# Patient Record
Sex: Male | Born: 1970 | Race: White | Hispanic: No | Marital: Married | Smoking: Former smoker
Health system: Southern US, Community
[De-identification: ages and names within clinical notes are randomized; demographics above are authoritative.]

## PROBLEM LIST (undated history)

## (undated) DIAGNOSIS — I10 Essential (primary) hypertension: Secondary | ICD-10-CM

## (undated) DIAGNOSIS — I2510 Atherosclerotic heart disease of native coronary artery without angina pectoris: Secondary | ICD-10-CM

## (undated) DIAGNOSIS — E669 Obesity, unspecified: Secondary | ICD-10-CM

## (undated) DIAGNOSIS — E785 Hyperlipidemia, unspecified: Secondary | ICD-10-CM

## (undated) HISTORY — PX: HERNIA REPAIR: SHX51

## (undated) HISTORY — DX: Hyperlipidemia, unspecified: E78.5

## (undated) HISTORY — DX: Essential (primary) hypertension: I10

## (undated) HISTORY — DX: Atherosclerotic heart disease of native coronary artery without angina pectoris: I25.10

## (undated) HISTORY — DX: Obesity, unspecified: E66.9

---

## 2020-01-29 ENCOUNTER — Ambulatory Visit: Payer: Self-pay | Attending: Internal Medicine

## 2020-01-29 DIAGNOSIS — Z23 Encounter for immunization: Secondary | ICD-10-CM

## 2020-01-29 NOTE — Progress Notes (Signed)
   Covid-19 Vaccination Clinic  Name:  Nicholas Gillespie    MRN: [id number] DOB: Jun 30, 1971  01/29/2020  Nicholas Gillespie was observed post Covid-19 immunization for 15 minutes without incident. He was provided with Vaccine Information Sheet and instruction to access the V-Safe system.   Nicholas Gillespie was instructed to call 911 with any severe reactions post vaccine: Marland Kitchen Difficulty breathing  . Swelling of face and throat  . A fast heartbeat  . A bad rash all over body  . Dizziness and weakness   Immunizations Administered    Name Date Dose VIS Date Route   Pfizer COVID-19 Vaccine 01/29/2020  1:57 PM 0.3 mL 10/01/2019 Intramuscular   Manufacturer: [employer], [employer]   Lot: [id number]   NDC: [address]-0

## 2020-02-23 ENCOUNTER — Ambulatory Visit: Payer: Self-pay | Attending: Internal Medicine

## 2020-02-23 DIAGNOSIS — Z23 Encounter for immunization: Secondary | ICD-10-CM

## 2020-02-23 NOTE — Progress Notes (Signed)
   Covid-19 Vaccination Clinic  Name:  Nicholas Gillespie    MRN: [id number] DOB: 09/12/1971  02/23/2020  Nicholas Gillespie was observed post Covid-19 immunization for 15 minutes without incident. He was provided with Vaccine Information Sheet and instruction to access the V-Safe system.   Nicholas Gillespie was instructed to call 911 with any severe reactions post vaccine: Marland Kitchen Difficulty breathing  . Swelling of face and throat  . A fast heartbeat  . A bad rash all over body  . Dizziness and weakness   Immunizations Administered    Name Date Dose VIS Date Route   Pfizer COVID-19 Vaccine 02/23/2020  4:02 PM 0.3 mL 12/15/2018 Intramuscular   Manufacturer: [employer], [employer]   Lot: [id number]   NDC: [address]-1

## 2020-04-23 ENCOUNTER — Other Ambulatory Visit: Payer: Self-pay

## 2020-04-23 ENCOUNTER — Emergency Department (HOSPITAL_COMMUNITY): Payer: Managed Care, Other (non HMO)

## 2020-04-23 ENCOUNTER — Encounter (HOSPITAL_COMMUNITY): Payer: Self-pay

## 2020-04-23 ENCOUNTER — Emergency Department (HOSPITAL_COMMUNITY)
Admission: EM | Admit: 2020-04-23 | Discharge: 2020-04-23 | Disposition: A | Discharge status: Home / Self Care | Payer: Managed Care, Other (non HMO) | Attending: Emergency Medicine | Admitting: Emergency Medicine

## 2020-04-23 DIAGNOSIS — R7989 Other specified abnormal findings of blood chemistry: Secondary | ICD-10-CM | POA: Insufficient documentation

## 2020-04-23 DIAGNOSIS — R0789 Other chest pain: Secondary | ICD-10-CM | POA: Diagnosis not present

## 2020-04-23 DIAGNOSIS — R079 Chest pain, unspecified: Secondary | ICD-10-CM

## 2020-04-23 DIAGNOSIS — I10 Essential (primary) hypertension: Secondary | ICD-10-CM | POA: Diagnosis not present

## 2020-04-23 DIAGNOSIS — R000 Tachycardia, unspecified: Secondary | ICD-10-CM | POA: Insufficient documentation

## 2020-04-23 LAB — CBC
HCT: 46.7 % (ref 39.0–52.0)
Hemoglobin: 16.2 g/dL (ref 13.0–17.0)
MCH: 29.7 pg (ref 26.0–34.0)
MCHC: 34.7 g/dL (ref 30.0–36.0)
MCV: 85.5 fL (ref 80.0–100.0)
Platelets: 321 10*3/uL (ref 150–400)
RBC: 5.46 MIL/uL (ref 4.22–5.81)
RDW: 12.8 % (ref 11.5–15.5)
WBC: 7.6 10*3/uL (ref 4.0–10.5)
nRBC: 0 % (ref 0.0–0.2)

## 2020-04-23 LAB — BASIC METABOLIC PANEL
Anion gap: 9 (ref 5–15)
BUN: 16 mg/dL (ref 6–20)
CO2: 27 mmol/L (ref 22–32)
Calcium: 9.7 mg/dL (ref 8.9–10.3)
Chloride: 103 mmol/L (ref 98–111)
Creatinine, Ser: 1.34 mg/dL — ABNORMAL HIGH (ref 0.61–1.24)
GFR calc Af Amer: >60 mL/min (ref >60)
GFR calc non Af Amer: >60 mL/min (ref >60)
Glucose, Bld: 101 mg/dL — ABNORMAL HIGH (ref 70–99)
Potassium: 3.9 mmol/L (ref 3.5–5.1)
Sodium: 139 mmol/L (ref 135–145)

## 2020-04-23 LAB — TROPONIN I (HIGH SENSITIVITY): Troponin I (High Sensitivity): 3 ng/L (ref <18)

## 2020-04-23 NOTE — ED Notes (Signed)
Patient denies pain and is resting comfortably.  

## 2020-04-23 NOTE — Discharge Instructions (Addendum)
Please make sure to follow-up with your primary care doctor about your blood pressure and your slightly worsening kidney function.  Your work-up today was overall reassuring.  Return to the ER if your symptoms worsen.

## 2020-04-23 NOTE — ED Triage Notes (Signed)
Pt reports experiencing some indigestion yesterday. Pt reports hx of indigestion. Pt states he checked his BP this morning and it was elevated. Pt reports father had a "silent heart attack and stroke" and he is worried that is happening to him.

## 2020-04-23 NOTE — ED Provider Notes (Signed)
[facility]-EMERGENCY DEPT Provider Note   CSN: [id number] Arrival date & time: 04/23/20  1143     History Chief Complaint  Patient presents with  . Hypertension    Nicholas Gillespie is a 49 y.o. male.  HPI 49 year old male with no significant medical history presents to the ER with central chest tightness/burning across the chest which occurred yesterday morning.  He states that he has a history of indigestion, but normally does not have chest burning.  Yesterday morning he had an episode of chest tightness and burning that lasted for approximately an hour or 2.  He states that it then went away.  However then returned again in the evening.  He did not take anything for indigestion at the time.  After the episode of chest burning and tightness that occurred last night, he has not had any recurrent pain.  He states that his father had a silent MI and stroke in his worried about this which is brought him into the ER today.  He has not seen a primary care doctor in quite some time, but states that he thinks that he might have applied blood pressure.  He denies any shortness of breath, diaphoresis, radiating chest pain, cough, fevers, chills abdominal pain, back pain, nausea, vomiting, headache, weakness, numbness or tingling.    History reviewed. No pertinent past medical history.  There are no problems to display for this patient.   History reviewed. No pertinent surgical history.     History reviewed. No pertinent family history.  Social History   Tobacco Use  . Smoking status: Not on file  Substance Use Topics  . Alcohol use: Not on file  . Drug use: Not on file    Home Medications Prior to Admission medications   Not on File    Allergies    Patient has no allergy information on record.  Review of Systems   Review of Systems  Constitutional: Negative for chills and fever.  HENT: Negative for ear pain and sore throat.   Eyes: Negative for pain  and visual disturbance.  Respiratory: Positive for chest tightness. Negative for cough and shortness of breath.   Cardiovascular: Negative for chest pain and palpitations.  Gastrointestinal: Negative for abdominal pain and vomiting.  Genitourinary: Negative for dysuria and hematuria.  Musculoskeletal: Negative for arthralgias and back pain.  Skin: Negative for color change and rash.  Neurological: Negative for dizziness, seizures, syncope and weakness.  Psychiatric/Behavioral: Negative for confusion.  All other systems reviewed and are negative.   Physical Exam Updated Vital Signs BP (!) 156/103 (BP Location: Right Arm)   Pulse (!) 117   Temp 98.7 F (37.1 C) (Oral)   Resp 18   SpO2 100%   Physical Exam Vitals and nursing note reviewed.  Constitutional:      General: He is not in acute distress.    Appearance: Normal appearance. He is well-developed. He is not ill-appearing, toxic-appearing or diaphoretic.  HENT:     Head: Normocephalic and atraumatic.     Nose: Nose normal.     Mouth/Throat:     Pharynx: Oropharynx is clear.  Eyes:     Conjunctiva/sclera: Conjunctivae normal.     Pupils: Pupils are equal, round, and reactive to light.  Cardiovascular:     Rate and Rhythm: Normal rate and regular rhythm.     Heart sounds: No murmur heard.   Pulmonary:     Effort: Pulmonary effort is normal. No respiratory distress.  Breath sounds: Normal breath sounds.  Abdominal:     General: Abdomen is flat.     Palpations: Abdomen is soft.     Tenderness: There is no abdominal tenderness.  Musculoskeletal:        General: No tenderness or deformity. Normal range of motion.     Cervical back: Neck supple.  Skin:    General: Skin is warm and dry.     Findings: No erythema or rash.  Neurological:     General: No focal deficit present.     Mental Status: He is alert and oriented to person, place, and time.     Sensory: No sensory deficit.     Motor: No weakness.  Psychiatric:         Mood and Affect: Mood normal.        Behavior: Behavior normal.     ED Results / Procedures / Treatments   Labs (all labs ordered are listed, but only abnormal results are displayed) Labs Reviewed  BASIC METABOLIC PANEL - Abnormal; Notable for the following components:      Result Value   Glucose, Bld 101 (*)    Creatinine, Ser 1.34 (*)    All other components within normal limits  CBC  TROPONIN I (HIGH SENSITIVITY)  TROPONIN I (HIGH SENSITIVITY)    EKG EKG Interpretation  Date/Time:  Sunday April 23 2020 12:19:41 EDT Ventricular Rate:  107 PR Interval:    QRS Duration: 79 QT Interval:  323 QTC Calculation: 431 R Axis:   67 Text Interpretation: Sinus tachycardia Low voltage, precordial leads Borderline repolarization abnormality Confirmed by Allen, Anthony ([id number]) on 04/23/2020 1:59:38 PM   Radiology DG Chest 2 View  Result Date: 04/23/2020 CLINICAL DATA:  Chest pain EXAM: CHEST - 2 VIEW COMPARISON:  None. FINDINGS: The heart size and mediastinal contours are within normal limits. Both lungs are clear. The visualized skeletal structures are unremarkable. IMPRESSION: No active cardiopulmonary disease. Electronically Signed   By: Tyler  Litton M.D.   On: 04/23/2020 13:37    Procedures Procedures (including critical care time)  Medications Ordered in ED Medications - No data to display  ED Course  I have reviewed the triage vital signs and the nursing notes.  Pertinent labs & imaging results that were available during my care of the patient were reviewed by me and considered in my medical decision making (see chart for details).    MDM Rules/Calculators/A&P                         48  year old male with 2 episodes of chest pain that occurred yesterday, no episodes today On presentation, he is alert and oriented, nontoxic-appearing, in no acute distress, speaking in full sentences without increased work of breathing.  On presentation, he is slightly hypertensive  with a blood pressure of 156/103 and slightly tachycardic, however no signs of hypertensive urgency/emergency.  EKG sinus tachycardia, no ST elevations or T wave inversions.  CBC without abnormalities, BMP with a increased creatinine 1.34, however no other values to compare this to.  Initial troponin of 3.  Chest x-ray without acute abnormalities. Heart score of 3.  I do not think he needs a delta troponin given its been more than 6 hours.  I stressed that he needs to follow-up with his primary care doctor for wellness check.  Strict return precautions given.  All the patient's questions have been answered to his satisfaction, he voices understanding is agreeable to this plan.  At this stage in the ED course, the patient is medically screened and stable for discharge  Patient was seen and evaluated by Dr. Freida Busman who is agreeable to the above plan and disposition.  Final Clinical Impression(s) / ED Diagnoses Final diagnoses:  Chest pain, unspecified type  Hypertension, unspecified type  Elevated serum creatinine    Rx / DC Orders ED Discharge Orders    None       Leone Brand 04/23/20 1415    Lorre Nick, MD 04/23/20 1524

## 2020-04-23 NOTE — ED Provider Notes (Signed)
Medical screening examination/treatment/procedure(s) were conducted as a shared visit with non-physician practitioner(s) and myself.  I personally evaluated the patient during the encounter.  EKG Interpretation  Date/Time:  Sunday April 23 2020 12:19:41 EDT Ventricular Rate:  107 PR Interval:    QRS Duration: 79 QT Interval:  323 QTC Calculation: 431 R Axis:   67 Text Interpretation: Sinus tachycardia Low voltage, precordial leads Borderline repolarization abnormality Confirmed by Lorre Nick ([id number]) on 04/23/2020 1:62:69 PM  49 year old male presents with atypical chest pain x1 day.  Symptoms are better with walking and with eating.  No associated nausea, diaphoresis, exertional component.  Troponin here is negative.  Heart score is 3.  Will discharge home   Lorre Nick, MD 04/23/20 1409

## 2020-05-01 ENCOUNTER — Ambulatory Visit: Payer: Managed Care, Other (non HMO) | Admitting: Cardiology

## 2020-05-01 ENCOUNTER — Encounter: Payer: Self-pay | Admitting: Cardiology

## 2020-05-01 ENCOUNTER — Other Ambulatory Visit: Payer: Self-pay

## 2020-05-01 VITALS — BP 151/96 | HR 88 | Ht 69.0 in | Wt 229.0 lb

## 2020-05-01 DIAGNOSIS — R072 Precordial pain: Secondary | ICD-10-CM

## 2020-05-01 DIAGNOSIS — I10 Essential (primary) hypertension: Secondary | ICD-10-CM

## 2020-05-01 DIAGNOSIS — Z87891 Personal history of nicotine dependence: Secondary | ICD-10-CM

## 2020-05-01 DIAGNOSIS — E6609 Other obesity due to excess calories: Secondary | ICD-10-CM

## 2020-05-01 NOTE — Progress Notes (Signed)
Date:  05/01/2020   ID:  Lorrine Kin, DOB [birth year], MRN [id number]  PCP:  Merri Brunette, MD  Cardiologist:  Tessa Lerner, DO, [facility] (established care 05/01/2020)  REASON FOR CONSULT: Chest pain  REQUESTING PHYSICIAN:  Merri Brunette, MD [address] [place],  [place] [address]  Chief Complaint  Patient presents with  . Chest Pain  . New Patient (Initial Visit)    HPI  Nicholas Gillespie is a 49 y.o. male who presents to the office with a chief complaint of " chest pain." He is referred to the office at the request of Merri Brunette, MD. Patient's past medical history and cardiovascular risk factors include: Hypertension, former smoker, obesity due to excess calories.  Patient is referred to the office at the request of his primary care provider for evaluation of chest pain.  Patient states on 4 July of long weekend he was having symptoms of chest burning-like sensation that started on 3 July.  He stated that the discomfort was over his entire anterior chest wall.  The discomfort improved the following morning on 4 July but he is still not back to his baseline as a result goes to the hospital for further evaluation.  He had laboratory blood work done as well as EKG in the ER and was later sent home to follow-up as outpatient.  He followed up with his primary care provider thereafter was started on beta-blocker therapy and he has been working on lifestyle modifications.  He was referred to cardiology for further evaluation.  Patient states that he no longer has chest discomfort or burning-like sensation.  But when did occur he was across his anterior chest wall, nonpleuritic in nature and nonpositional.  It felt like a pressure-like sensation, 4 out of 10 in intensity, nonradiating, it actually improved with physical activity and did not resolve with rest it was just longstanding and eventually self-limiting.  No premature coronary artery disease in the family. Dad died  at 63 due to heart diease and stroke.   Denies prior history of coronary artery disease, myocardial infarction, congestive heart failure, deep venous thrombosis, pulmonary embolism, stroke, transient ischemic attack.  FUNCTIONAL STATUS: No structured exercise program or daily routine.    ALLERGIES: No Known Allergies  MEDICATION LIST PRIOR TO VISIT: Current Meds  Medication Sig  . metoprolol succinate (TOPROL-XL) 25 MG 24 hr tablet Take 25 mg by mouth daily.  . Multiple Vitamins-Minerals (MULTIVITAMIN WITH MINERALS) tablet Take 1 tablet by mouth daily.  . Omega-3 Fatty Acids (FISH OIL) 1000 MG CAPS Take by mouth.  . polycarbophil (FIBERCON) 625 MG tablet Take 625 mg by mouth daily.     PAST MEDICAL HISTORY: Past Medical History:  Diagnosis Date  . Hypertension   . Obesity     PAST SURGICAL HISTORY: Past Surgical History:  Procedure Laterality Date  . HERNIA REPAIR      FAMILY HISTORY: The patient family history includes CAD in his maternal grandfather; Heart attack in his father; Hyperlipidemia in his mother; Hypertension in his mother; Stroke in his father, maternal grandfather, and maternal grandmother.  SOCIAL HISTORY:  The patient  reports that he quit smoking about 21 years ago. His smoking use included cigarettes. He has a 18.00 pack-year smoking history. He has never used smokeless tobacco. He reports current alcohol use.  REVIEW OF SYSTEMS: Review of Systems  Constitutional: Negative for chills and fever.  HENT: Negative for hoarse voice and nosebleeds.   Eyes: Negative for discharge, double  vision and pain.  Cardiovascular: Negative for chest pain, claudication, dyspnea on exertion, leg swelling, near-syncope, orthopnea, palpitations, paroxysmal nocturnal dyspnea and syncope.  Respiratory: Negative for hemoptysis and shortness of breath.   Musculoskeletal: Negative for muscle cramps and myalgias.  Gastrointestinal: Negative for abdominal pain, constipation,  diarrhea, hematemesis, hematochezia, melena, nausea and vomiting.  Neurological: Negative for dizziness and light-headedness.    PHYSICAL EXAM: Vitals with BMI 05/01/2020 04/23/2020 04/23/2020  Height 5\' 9"  - -  Weight 229 lbs - -  BMI 33.8 - -  Systolic 151 162  Diastolic 96 110 102  Pulse 88 95 98    CONSTITUTIONAL: Well-developed and well-nourished. No acute distress.  SKIN: Skin is warm and dry. No rash noted. No cyanosis. No pallor. No jaundice HEAD: Normocephalic and atraumatic.  EYES: No scleral icterus MOUTH/THROAT: Moist oral membranes.  NECK: No JVD present. No thyromegaly noted. No carotid bruits  LYMPHATIC: No visible cervical adenopathy.  CHEST Normal respiratory effort. No intercostal retractions  LUNGS: Clear to auscultation bilaterally.  No stridor. No wheezes. No rales.  CARDIOVASCULAR: Regular rate and rhythm, positive S1-S2, no murmurs rubs or gallops appreciated ABDOMINAL: Obese, soft, nontender, nondistended, positive bowel sounds in all 4 quadrants.  No apparent ascites.  EXTREMITIES: No peripheral edema.  2+ bilateral dorsalis pedis and posterior tibial pulses HEMATOLOGIC: No significant bruising NEUROLOGIC: Oriented to person, place, and time. Nonfocal. Normal muscle tone.  PSYCHIATRIC: Normal mood and affect. Normal behavior. Cooperative  CARDIAC DATABASE: EKG: 05/01/2020: Normal sinus rhythm, 90 bpm, normal axis, without underlying injury pattern.  Echocardiogram: None  Stress Testing: None  Heart Catheterization: None  LABORATORY DATA: CBC Latest Ref Rng & Units 04/23/2020  WBC 4.0 - 10.5 K/uL 7.6  Hemoglobin 13.0 - 17.0 g/dL 06/24/2020  Hematocrit 39 - 52 % 46.7  Platelets 150 - 400 K/uL 321    CMP Latest Ref Rng & Units 04/23/2020  Glucose 70 - 99 mg/dL 06/24/2020)  BUN 6 - 20 mg/dL 16  Creatinine 993(Z - 1.69 mg/dL 6.78)  Sodium 9.38(B - 017 mmol/L 139  Potassium 3.5 - 5.1 mmol/L 3.9  Chloride 98 - 111 mmol/L 103  CO2 22 - 32 mmol/L 27  Calcium  8.9 - 10.3 mg/dL 9.7    Lipid Panel  No results found for: CHOL, TRIG, HDL, CHOLHDL, VLDL, LDLCALC, LDLDIRECT, LABVLDL  No components found for: NTPROBNP No results for input(s): PROBNP in the last 8760 hours. No results for input(s): TSH in the last 8760 hours.  BMP Recent Labs    04/23/20 1237  NA 139  K 3.9  CL 103  CO2 27  GLUCOSE 101*  BUN 16  CREATININE 1.34*  CALCIUM 9.7  GFRNONAA >60  GFRAA >60    HEMOGLOBIN A1C No results found for: HGBA1C, MPG  IMPRESSION:    ICD-10-CM   1. Precordial pain  R07.2 EKG 12-Lead    PCV CARDIAC STRESS TEST    CT CARDIAC SCORING    PCV ECHOCARDIOGRAM COMPLETE  2. Benign hypertension  I10   3. Former smoker  Z87.891   4. Class 1 obesity due to excess calories without serious comorbidity with body mass index (BMI) of 33.0 to 33.9 in adult  E66.09    Z68.33      RECOMMENDATIONS: Nicholas Gillespie is a 49 y.o. male whose past medical history and cardiac risk factors include: Benign essential hypertension, former smoker, obesity due to excess calories.  Precordial chest pain:  EKG shows normal sinus rhythm without underlying injury  pattern.  Echocardiogram will be ordered to evaluate for structural heart disease and left ventricular systolic function.  Coronary artery calcification score for further risk stratification.  Exercise treadmill stress test.  Patient is asked to hold his beta-blockers 48 hours prior to his stress test.  Benign essential hypertension: His blood pressure currently not well controlled.  He is currently working with his primary care provider to uptitrate medical therapy.  He has a follow-up scheduled coming up.  He is asked to keep a log of his blood pressures and to take them with him at the next visit for further medication adjustment if clinically warranted.  Former smoker: Educated on the importance of continued smoking cessation.  Obesity, due to excess calories: . Body mass index is 33.82  kg/m. . I reviewed with the patient the importance of diet, regular physical activity/exercise, weight loss.   . Patient is educated on increasing physical activity gradually as tolerated.  With the goal of moderate intensity exercise for 30 minutes a day 5 days a week.  FINAL MEDICATION LIST END OF ENCOUNTER: No orders of the defined types were placed in this encounter.   There are no discontinued medications.   Current Outpatient Medications:  .  metoprolol succinate (TOPROL-XL) 25 MG 24 hr tablet, Take 25 mg by mouth daily., Disp: , Rfl:  .  Multiple Vitamins-Minerals (MULTIVITAMIN WITH MINERALS) tablet, Take 1 tablet by mouth daily., Disp: , Rfl:  .  Omega-3 Fatty Acids (FISH OIL) 1000 MG CAPS, Take by mouth., Disp: , Rfl:  .  polycarbophil (FIBERCON) 625 MG tablet, Take 625 mg by mouth daily., Disp: , Rfl:   Orders Placed This Encounter  Procedures  . CT CARDIAC SCORING  . PCV CARDIAC STRESS TEST  . EKG 12-Lead  . PCV ECHOCARDIOGRAM COMPLETE    There are no Patient Instructions on file for this visit.   --Continue cardiac medications as reconciled in final medication list. --Return in about 4 weeks (around 05/29/2020) for re-evaluation of symptoms., review test results.. Or sooner if needed. --Continue follow-up with your primary care physician regarding the management of your other chronic comorbid conditions.  Patient's questions and concerns were addressed to his satisfaction. He voices understanding of the instructions provided during this encounter.   This note was created using a voice recognition software as a result there may be grammatical errors inadvertently enclosed that do not reflect the nature of this encounter. Every attempt is made to correct such errors.  Tessa Lerner, [place], [facility]  Pager: [phone] Office: [phone]

## 2020-05-05 ENCOUNTER — Ambulatory Visit: Payer: Managed Care, Other (non HMO)

## 2020-05-05 ENCOUNTER — Other Ambulatory Visit: Payer: Self-pay

## 2020-05-05 DIAGNOSIS — R072 Precordial pain: Secondary | ICD-10-CM

## 2020-05-07 NOTE — Progress Notes (Signed)
External Labs: Collected: 04/27/2020 Hemoglobin: 15.4 g/dL Creatinine 1.27 mg/dL. eGFR: 67/78 mL/min per 1.73 m Lipid profile: Total cholesterol 286, triglycerides 183, HDL 37, LDL 214 TSH: 3.15

## 2020-05-08 ENCOUNTER — Other Ambulatory Visit: Payer: Self-pay

## 2020-05-08 ENCOUNTER — Ambulatory Visit: Payer: Managed Care, Other (non HMO)

## 2020-05-08 DIAGNOSIS — R072 Precordial pain: Secondary | ICD-10-CM

## 2020-05-14 NOTE — Progress Notes (Signed)
Coronary artery calcification scoring performed on 05/12/2020 at [facility]: Left main: 6 Left anterior descending 34 Left circumflex: 0 Right coronary artery: 75 Posterior descending artery: 0 Total calcium score: 115 AU, moderate calcified coronary artery plaque putting the patient just below the 90th percentile for age.  Please inform the patient that Nicholas Gillespie total coronary calcification score is 115.  I would like to see him sooner than Nicholas Gillespie scheduled appointment to discuss the results of the echo, stress test and calcium score.  If he is unable to move up the appointment please reassure him that he needs to keep Nicholas Gillespie upcoming appointment

## 2020-05-19 ENCOUNTER — Ambulatory Visit: Payer: Managed Care, Other (non HMO) | Admitting: Cardiology

## 2020-05-19 ENCOUNTER — Other Ambulatory Visit: Payer: Self-pay

## 2020-05-19 ENCOUNTER — Encounter: Payer: Self-pay | Admitting: Cardiology

## 2020-05-19 VITALS — BP 150/90 | HR 109 | Ht 69.0 in | Wt 225.4 lb

## 2020-05-19 DIAGNOSIS — E6609 Other obesity due to excess calories: Secondary | ICD-10-CM

## 2020-05-19 DIAGNOSIS — I2510 Atherosclerotic heart disease of native coronary artery without angina pectoris: Secondary | ICD-10-CM

## 2020-05-19 DIAGNOSIS — E782 Mixed hyperlipidemia: Secondary | ICD-10-CM

## 2020-05-19 DIAGNOSIS — I2584 Coronary atherosclerosis due to calcified coronary lesion: Secondary | ICD-10-CM

## 2020-05-19 DIAGNOSIS — Z712 Person consulting for explanation of examination or test findings: Secondary | ICD-10-CM

## 2020-05-19 DIAGNOSIS — I10 Essential (primary) hypertension: Secondary | ICD-10-CM

## 2020-05-19 DIAGNOSIS — Z87891 Personal history of nicotine dependence: Secondary | ICD-10-CM

## 2020-05-19 MED ORDER — ASPIRIN EC 81 MG PO TBEC: 81.0000 mg | DELAYED_RELEASE_TABLET | Freq: Every day | ORAL | 3 refills | Status: AC

## 2020-05-19 NOTE — Progress Notes (Signed)
Date:  05/20/2020   ID:  [name], DOB 1971-08-23, MRN [id number]  PCP:  Deland Pretty, MD  Cardiologist:  Rex Kras, DO, [facility] (established care 05/01/2020)  Date: 05/20/20 Last Office Visit: 05/01/2020  Chief Complaint  Patient presents with  . Follow-up  . Precordial pain    4 weeks, review results    HPI  Nicholas Gillespie is a 49 y.o. male who presents to the office with a chief complaint of " reevaluation of chest pain and review test results.". Patient's past medical history and cardiovascular risk factors include: Hypertension, former smoker, coronary artery calcification (115 AU), obesity due to excess calories.  Patient is referred to the office at the request of his primary care provider for evaluation of chest pain back in July 2021.  Prior to establishing care patient had an episode of atypical chest discomfort over his Fourth of July weekend.  He had gone to the ER and later discharged and asked to follow-up with cardiology.  Patient was referred to the office by his PCP.  At that time he was recommended to undergo an echocardiogram, exercise treadmill stress test, and coronary artery calcification scoring for further risk stratification.  He presents today for follow-up and review test results.  Since last visit patient denies any chest pain at rest or with effort related activities.  Patient is informed that his LVEF is hyperdynamic with mild MR.  His exercise treadmill stress test was reported to be low risk as the patient was able to exercise and achieve 10.9 METS and greater than 85% of maximum predicted heart rate.  And stress ECG negative for ischemia.  Patient also underwent coronary artery calcification score which was noted to be 115 consistent with at least moderate coronary artery calcification placing the patient at 90th percentile for age matched cohorts.  No premature coronary artery disease in the family. Dad died at 34 due to heart diease and  stroke.   Denies prior history of myocardial infarction, congestive heart failure, deep venous thrombosis, pulmonary embolism, stroke, transient ischemic attack.  FUNCTIONAL STATUS: No structured exercise program or daily routine.    ALLERGIES: No Known Allergies  MEDICATION LIST PRIOR TO VISIT: Current Meds  Medication Sig  . atorvastatin (LIPITOR) 40 MG tablet Take 40 mg by mouth daily.  . metoprolol succinate (TOPROL-XL) 50 MG 24 hr tablet Take 50 mg by mouth daily.  . Multiple Vitamins-Minerals (MULTIVITAMIN WITH MINERALS) tablet Take 1 tablet by mouth daily.  . Omega-3 Fatty Acids (FISH OIL) 1000 MG CAPS Take by mouth.  . polycarbophil (FIBERCON) 625 MG tablet Take 625 mg by mouth daily.     PAST MEDICAL HISTORY: Past Medical History:  Diagnosis Date  . Coronary artery calcification seen on computed tomography   . Hyperlipidemia   . Hypertension   . Obesity     PAST SURGICAL HISTORY: Past Surgical History:  Procedure Laterality Date  . HERNIA REPAIR      FAMILY HISTORY: The patient family history includes CAD in his maternal grandfather; Heart attack in his father; Hyperlipidemia in his mother; Hypertension in his mother; Stroke in his father, maternal grandfather, and maternal grandmother.  SOCIAL HISTORY:  The patient  reports that he quit smoking about 21 years ago. His smoking use included cigarettes. He has a 18.00 pack-year smoking history. He has never used smokeless tobacco. He reports current alcohol use.  REVIEW OF SYSTEMS: Review of Systems  Constitutional: Negative for chills and fever.  HENT: Negative for  hoarse voice and nosebleeds.   Eyes: Negative for discharge, double vision and pain.  Cardiovascular: Negative for chest pain, claudication, dyspnea on exertion, leg swelling, near-syncope, orthopnea, palpitations, paroxysmal nocturnal dyspnea and syncope.  Respiratory: Negative for hemoptysis and shortness of breath.   Musculoskeletal: Negative for  muscle cramps and myalgias.  Gastrointestinal: Negative for abdominal pain, constipation, diarrhea, hematemesis, hematochezia, melena, nausea and vomiting.  Neurological: Negative for dizziness and light-headedness.    PHYSICAL EXAM: Vitals with BMI 05/19/2020 05/01/2020 04/23/2020  Height 5' 9"  5' 9"  -  Weight 225 lbs 6 oz 229 lbs -  BMI 93.79 02.4 -  Systolic 097 353 299  Diastolic 90 96 242  Pulse 109 88 95    CONSTITUTIONAL: Well-developed and well-nourished. No acute distress.  SKIN: Skin is warm and dry. No rash noted. No cyanosis. No pallor. No jaundice HEAD: Normocephalic and atraumatic.  EYES: No scleral icterus MOUTH/THROAT: Moist oral membranes.  NECK: No JVD present. No thyromegaly noted. No carotid bruits  LYMPHATIC: No visible cervical adenopathy.  CHEST Normal respiratory effort. No intercostal retractions  LUNGS: Clear to auscultation bilaterally.  No stridor. No wheezes. No rales.  CARDIOVASCULAR: Regular rate and rhythm, positive S1-S2, no murmurs rubs or gallops appreciated ABDOMINAL: Obese, soft, nontender, nondistended, positive bowel sounds in all 4 quadrants.  No apparent ascites.  EXTREMITIES: No peripheral edema.  2+ bilateral dorsalis pedis and posterior tibial pulses HEMATOLOGIC: No significant bruising NEUROLOGIC: Oriented to person, place, and time. Nonfocal. Normal muscle tone.  PSYCHIATRIC: Normal mood and affect. Normal behavior. Cooperative  CARDIAC DATABASE: EKG: 05/01/2020: Normal sinus rhythm, 90 bpm, normal axis, without underlying injury pattern.  Echocardiogram: 05/05/2020: Hyperdynamic LV systolic function with visual EF >70%. Left ventricle cavity is normal in size. Mild left ventricular hypertrophy. Normal global wall motion. Normal diastolic filling pattern, normal LAP. Calculated EF 63%. Mild (Grade I) mitral regurgitation.  Stress Testing: Exercise treadmill stress test 05/08/2020:  Exercise treadmill stress test performed using Bruce  protocol. Patient reached 10.9 METS, and 108% of age predicted maximum heart rate. Exercise capacity was excellent. No chest pain reported. Normal heart rate and hemodynamic response. Stress EKG revealed no ischemic changes.  Low risk study.   Coronary artery calcification scoring performed on 05/12/2020 at [facility]:  Left main: 6  Left anterior descending 34  Left circumflex: 0  Right coronary artery: 75  Posterior descending artery: 0  Total calcium score: 115 AU, moderate calcified coronary artery plaque putting the patient just below the 90th percentile for age.  Heart Catheterization: None  LABORATORY DATA: CBC Latest Ref Rng & Units 04/23/2020  WBC 4.0 - 10.5 K/uL 7.6  Hemoglobin 13.0 - 17.0 g/dL 16.2  Hematocrit 39 - 52 % 46.7  Platelets 150 - 400 K/uL 321    CMP Latest Ref Rng & Units 04/23/2020  Glucose 70 - 99 mg/dL 101(H)  BUN 6 - 20 mg/dL 16  Creatinine 0.61 - 1.24 mg/dL 1.34(H)  Sodium 135 - 145 mmol/L 139  Potassium 3.5 - 5.1 mmol/L 3.9  Chloride 98 - 111 mmol/L 103  CO2 22 - 32 mmol/L 27  Calcium 8.9 - 10.3 mg/dL 9.7    External Labs: Collected: 04/27/2020 Hemoglobin: 15.4 g/dL Creatinine 1.27 mg/dL. eGFR: 67/78 mL/min per 1.73 m Lipid profile: Total cholesterol 286, triglycerides 183, HDL 37, LDL 214 TSH: 3.15   IMPRESSION:    ICD-10-CM   1. Coronary atherosclerosis due to calcified coronary lesion of native artery  I25.10 aspirin EC 81 MG tablet  I25.84   2. Benign hypertension  I10   3. Former smoker  Z87.891   4. Mixed hyperlipidemia  E78.2   5. Class 1 obesity due to excess calories without serious comorbidity with body mass index (BMI) of 33.0 to 33.9 in adult  E66.09    Z68.33   6. Encounter to discuss test results  Z71.2      RECOMMENDATIONS: Nicholas Gillespie is a 49 y.o. male whose past medical history and cardiac risk factors include: Benign essential hypertension, former smoker, obesity due to excess calories.  Coronary artery  calcification:  Patient had a dedicated coronary artery calcification study in July 2021 and was noted to have a total calcium score of 115AU.  Patient is already on statin medication.  Will start aspirin 81 mg p.o. daily.  Educated on the importance of improving modifiable cardiovascular risk factors.  Precordial chest pain: Resolved.  Given his atypical chest discomfort he underwent an echocardiogram and exercise treadmill stress test.  Results of which were reviewed with him in great detail at today's office visit.  Coronary artery calcification score and its significance discussed as well.  No recurrent episodes of chest discomfort since last visit.   Benign essential hypertension: Patient's office blood pressures are currently not at goal.  Patient states that he is working with his primary care provider on further uptitrating antihypertensive medications.  Educated on importance of low-salt diet.   Hyperlipidemia, mixed: Currently on Lipitor.  Currently managed by primary team.  Former smoker: Currently on statin therapy.r: Educated on the importance of continued smoking cessation.  Obesity, due to excess calories: Body mass index is 33.29 kg/m. . I reviewed with the patient the importance of diet, regular physical activity/exercise, weight loss.   . Patient is educated on increasing physical activity gradually as tolerated.  With the goal of moderate intensity exercise for 30 minutes a day 5 days a week.  FINAL MEDICATION LIST END OF ENCOUNTER: Meds ordered this encounter  Medications  . aspirin EC 81 MG tablet    Sig: Take 1 tablet (81 mg total) by mouth daily. Swallow whole.    Dispense:  90 tablet    Refill:  3     Current Outpatient Medications:  .  atorvastatin (LIPITOR) 40 MG tablet, Take 40 mg by mouth daily., Disp: , Rfl:  .  metoprolol succinate (TOPROL-XL) 50 MG 24 hr tablet, Take 50 mg by mouth daily., Disp: , Rfl:  .  Multiple Vitamins-Minerals (MULTIVITAMIN  WITH MINERALS) tablet, Take 1 tablet by mouth daily., Disp: , Rfl:  .  Omega-3 Fatty Acids (FISH OIL) 1000 MG CAPS, Take by mouth., Disp: , Rfl:  .  polycarbophil (FIBERCON) 625 MG tablet, Take 625 mg by mouth daily., Disp: , Rfl:  .  aspirin EC 81 MG tablet, Take 1 tablet (81 mg total) by mouth daily. Swallow whole., Disp: 90 tablet, Rfl: 3  No orders of the defined types were placed in this encounter.   There are no Patient Instructions on file for this visit.   --Continue cardiac medications as reconciled in final medication list. --Return in about 6 months (around 11/19/2020) for CAD follow up . Or sooner if needed. --Continue follow-up with your primary care physician regarding the management of your other chronic comorbid conditions.  Patient's questions and concerns were addressed to his satisfaction. He voices understanding of the instructions provided during this encounter.   This note was created using a voice recognition software as a result there may  be grammatical errors inadvertently enclosed that do not reflect the nature of this encounter. Every attempt is made to correct such errors.  Rex Kras, [place], [facility]  Pager: [phone] Office: [phone]

## 2020-05-20 ENCOUNTER — Encounter: Payer: Self-pay | Admitting: Cardiology

## 2020-05-29 ENCOUNTER — Ambulatory Visit: Payer: Managed Care, Other (non HMO) | Admitting: Cardiology

## 2020-11-20 ENCOUNTER — Other Ambulatory Visit: Payer: Self-pay

## 2020-11-20 ENCOUNTER — Encounter: Payer: Self-pay | Admitting: Cardiology

## 2020-11-20 ENCOUNTER — Ambulatory Visit: Payer: Managed Care, Other (non HMO) | Admitting: Cardiology

## 2020-11-20 VITALS — BP 138/88 | HR 82 | Temp 98.3°F | Ht 69.0 in | Wt 230.0 lb

## 2020-11-20 DIAGNOSIS — I2510 Atherosclerotic heart disease of native coronary artery without angina pectoris: Secondary | ICD-10-CM

## 2020-11-20 DIAGNOSIS — I2584 Coronary atherosclerosis due to calcified coronary lesion: Secondary | ICD-10-CM

## 2020-11-20 DIAGNOSIS — E6609 Other obesity due to excess calories: Secondary | ICD-10-CM

## 2020-11-20 DIAGNOSIS — E782 Mixed hyperlipidemia: Secondary | ICD-10-CM

## 2020-11-20 DIAGNOSIS — I10 Essential (primary) hypertension: Secondary | ICD-10-CM

## 2020-11-20 DIAGNOSIS — Z87891 Personal history of nicotine dependence: Secondary | ICD-10-CM

## 2020-11-20 NOTE — Progress Notes (Signed)
Date:  11/20/2020   ID:  Nicholas Gillespie, DOB Mar 18, 1971, MRN [id number]  PCP:  Deland Pretty, MD  Cardiologist:  Rex Kras, DO, [facility] (established care 05/01/2020)  Date: 11/20/20 Last Office Visit: 05/19/2020  Chief Complaint  Patient presents with  . Coronary Artery Disease  . Follow-up    HPI  Nicholas Gillespie is a 50 y.o. male who presents to the office with a chief complaint of "6 month follow up hx of coronary calcifications ". Patient's past medical history and cardiovascular risk factors include: Hypertension, former smoker, coronary artery calcification (115 AU), obesity due to excess calories.  Patient is referred to the office at the request of his primary care provider for evaluation of chest pain back in July 2021.  In the past patient has had atypical chest pain and therefore underwent cardiac evaluation.  He was noted to have moderate coronary artery calcification and therefore was recommended to be on aspirin and statin therapy.  Since last visit he has initiated aspirin 81 mg p.o. daily without any side effects or intolerances.  He was started on statin therapy but he informs me that his liver function test were abnormal on follow-up blood work and therefore statins have been discontinued.  He is currently on Zetia and managed by PCP.  Blood pressure log reviewed SBP ranges between 115-124mHg he states that norvasc was just increased. He tries to consume a low salt diet. He is walking 30-35 min per day 4x per week.   FUNCTIONAL STATUS: No structured exercise program or daily routine.    ALLERGIES: Allergies  Allergen Reactions  . Atorvastatin Other (See Comments)    Liver enzymes increased     MEDICATION LIST PRIOR TO VISIT: Current Meds  Medication Sig  . amLODipine (NORVASC) 5 MG tablet Take 5 mg by mouth daily.  .Marland Kitchenaspirin EC 81 MG tablet Take 1 tablet (81 mg total) by mouth daily. Swallow whole.  . ezetimibe (ZETIA) 10 MG tablet Take 10 mg by mouth  daily.  . metoprolol succinate (TOPROL-XL) 50 MG 24 hr tablet Take 50 mg by mouth daily.  . Multiple Vitamins-Minerals (MULTIVITAMIN WITH MINERALS) tablet Take 1 tablet by mouth daily.  . Omega-3 Fatty Acids (FISH OIL) 1000 MG CAPS Take by mouth.  . polycarbophil (FIBERCON) 625 MG tablet Take 625 mg by mouth daily.     PAST MEDICAL HISTORY: Past Medical History:  Diagnosis Date  . Coronary artery calcification seen on computed tomography   . Hyperlipidemia   . Hypertension   . Obesity     PAST SURGICAL HISTORY: Past Surgical History:  Procedure Laterality Date  . HERNIA REPAIR      FAMILY HISTORY: The patient family history includes CAD in his maternal grandfather; Heart attack in his father; Hyperlipidemia in his mother; Hypertension in his mother; Stroke in his father, maternal grandfather, and maternal grandmother.  SOCIAL HISTORY:  The patient  reports that he quit smoking about 21 years ago. His smoking use included cigarettes. He has a 18.00 pack-year smoking history. He has never used smokeless tobacco. He reports current alcohol use.  REVIEW OF SYSTEMS: Review of Systems  Constitutional: Negative for chills and fever.  HENT: Negative for hoarse voice and nosebleeds.   Eyes: Negative for discharge, double vision and pain.  Cardiovascular: Negative for chest pain, claudication, dyspnea on exertion, leg swelling, near-syncope, orthopnea, palpitations, paroxysmal nocturnal dyspnea and syncope.  Respiratory: Negative for hemoptysis and shortness of breath.   Musculoskeletal: Negative for muscle  cramps and myalgias.  Gastrointestinal: Negative for abdominal pain, constipation, diarrhea, hematemesis, hematochezia, melena, nausea and vomiting.  Neurological: Negative for dizziness and light-headedness.    PHYSICAL EXAM: Vitals with BMI 11/20/2020 05/19/2020 05/01/2020  Height - _0  _1   Weight 230 lbs 225 lbs 6 oz 229 lbs  BMI - 93.79 02.4  Systolic 097 353 299   Diastolic 88 90 96  Pulse 82 109 88    CONSTITUTIONAL: Well-developed and well-nourished. No acute distress.  SKIN: Skin is warm and dry. No rash noted. No cyanosis. No pallor. No jaundice HEAD: Normocephalic and atraumatic.  EYES: No scleral icterus MOUTH/THROAT: Moist oral membranes.  NECK: No JVD present. No thyromegaly noted. No carotid bruits  LYMPHATIC: No visible cervical adenopathy.  CHEST Normal respiratory effort. No intercostal retractions  LUNGS: Clear to auscultation bilaterally.  No stridor. No wheezes. No rales.  CARDIOVASCULAR: Regular rate and rhythm, positive S1-S2, no murmurs rubs or gallops appreciated ABDOMINAL: Obese, soft, nontender, nondistended, positive bowel sounds in all 4 quadrants.  No apparent ascites.  EXTREMITIES: No peripheral edema.  2+ bilateral dorsalis pedis and posterior tibial pulses HEMATOLOGIC: No significant bruising NEUROLOGIC: Oriented to person, place, and time. Nonfocal. Normal muscle tone.  PSYCHIATRIC: Normal mood and affect. Normal behavior. Cooperative  CARDIAC DATABASE: EKG: 11/20/2020: Normal sinus rhythm, normal axis, poor R wave progression, without underlying injury pattern.    Echocardiogram: 05/05/2020: Hyperdynamic LV systolic function with visual EF >70%. Left ventricle cavity is normal in size. Mild left ventricular hypertrophy. Normal global wall motion. Normal diastolic filling pattern, normal LAP. Calculated EF 63%. Mild (Grade I) mitral regurgitation.  Stress Testing: Exercise treadmill stress test 05/08/2020:  Exercise treadmill stress test performed using Bruce protocol. Patient reached 10.9 METS, and 108% of age predicted maximum heart rate. Exercise capacity was excellent. No chest pain reported. Normal heart rate and hemodynamic response. Stress EKG revealed no ischemic changes.  Low risk study.   Coronary artery calcification scoring performed on 05/12/2020 at [facility]:  Left main: 6  Left anterior  descending 34  Left circumflex: 0  Right coronary artery: 75  Posterior descending artery: 0  Total calcium score: 115 AU, moderate calcified coronary artery plaque putting the patient just below the 90th percentile for age.  Heart Catheterization: None  LABORATORY DATA: CBC Latest Ref Rng & Units 04/23/2020  WBC 4.0 - 10.5 K/uL 7.6  Hemoglobin 13.0 - 17.0 g/dL 16.2  Hematocrit 39.0 - 52.0 % 46.7  Platelets 150 - 400 K/uL 321    CMP Latest Ref Rng & Units 04/23/2020  Glucose 70 - 99 mg/dL 101(H)  BUN 6 - 20 mg/dL 16  Creatinine 0.61 - 1.24 mg/dL 1.34(H)  Sodium 135 - 145 mmol/L 139  Potassium 3.5 - 5.1 mmol/L 3.9  Chloride 98 - 111 mmol/L 103  CO2 22 - 32 mmol/L 27  Calcium 8.9 - 10.3 mg/dL 9.7    External Labs: Collected: 04/27/2020 Hemoglobin: 15.4 g/dL Creatinine 1.27 mg/dL. eGFR: 67/78 mL/min per 1.73 m Lipid profile: Total cholesterol 286, triglycerides 183, HDL 37, LDL 214 TSH: 3.15   IMPRESSION:    ICD-10-CM   1. Coronary atherosclerosis due to calcified coronary lesion of native artery  I25.10 EKG 12-Lead   I25.84   2. Benign hypertension  I10   3. Former smoker  Z87.891   4. Mixed hyperlipidemia  E78.2   5. Class 1 obesity due to excess calories without serious comorbidity with body mass index (BMI) of 33.0 to 33.9 in adult  E66.09    G92.11      RECOMMENDATIONS: Nicholas Gillespie is a 50 y.o. male whose past medical history and cardiac risk factors include: Benign essential hypertension, former smoker, obesity due to excess calories.  Coronary artery calcification:  Noted to have moderate coronary artery calcification, total coronary calcium score 115 AU.  Continue aspirin 81 mg p.o. daily.  Patient was started on statin therapy and states that follow-up blood work noted abnormal liver function test and therefore Lipitor was discontinued.    He has been transition to Zetia by his PCP, agree with the change.    Since last visit no chest pain or  anginal equivalents.  Benign essential hypertension: Office blood pressure is within acceptable range but not at goal.  Recommended goal SBP around 143mHg as he in non-diabetic. Blood pressure log reviewed.  Currently managed by primary care provider.    Hyperlipidemia, mixed: Currently on Zetia.  Currently managed by primary team.  Former smoker: Educated on the importance of continued smoking cessation.  Obesity, due to excess calories: Body mass index is 33.97 kg/m. . I reviewed with the patient the importance of diet, regular physical activity/exercise, weight loss.   . Patient is educated on increasing physical activity gradually as tolerated.  With the goal of moderate intensity exercise for 30 minutes a day 5 days a week.  FINAL MEDICATION LIST END OF ENCOUNTER: No orders of the defined types were placed in this encounter.    Current Outpatient Medications:  .  amLODipine (NORVASC) 5 MG tablet, Take 5 mg by mouth daily., Disp: , Rfl:  .  aspirin EC 81 MG tablet, Take 1 tablet (81 mg total) by mouth daily. Swallow whole., Disp: 90 tablet, Rfl: 3 .  ezetimibe (ZETIA) 10 MG tablet, Take 10 mg by mouth daily., Disp: , Rfl:  .  metoprolol succinate (TOPROL-XL) 50 MG 24 hr tablet, Take 50 mg by mouth daily., Disp: , Rfl:  .  Multiple Vitamins-Minerals (MULTIVITAMIN WITH MINERALS) tablet, Take 1 tablet by mouth daily., Disp: , Rfl:  .  Omega-3 Fatty Acids (FISH OIL) 1000 MG CAPS, Take by mouth., Disp: , Rfl:  .  polycarbophil (FIBERCON) 625 MG tablet, Take 625 mg by mouth daily., Disp: , Rfl:   Orders Placed This Encounter  Procedures  . EKG 12-Lead    There are no Patient Instructions on file for this visit.   --Continue cardiac medications as reconciled in final medication list. --Return in about 1 year (around 11/20/2021) for Follow up, Coronary artery calcification. Or sooner if needed. --Continue follow-up with your primary care physician regarding the management of your  other chronic comorbid conditions.  Patient's questions and concerns were addressed to his satisfaction. He voices understanding of the instructions provided during this encounter.   This note was created using a voice recognition software as a result there may be grammatical errors inadvertently enclosed that do not reflect the nature of this encounter. Every attempt is made to correct such errors.  SRex Kras DN[place] FInd[facility] Health Pager: [phone]: [phone]

## 2020-11-23 ENCOUNTER — Other Ambulatory Visit: Payer: Self-pay

## 2020-11-23 DIAGNOSIS — Z79899 Other long term (current) drug therapy: Secondary | ICD-10-CM | POA: Diagnosis not present

## 2020-11-23 DIAGNOSIS — I10 Essential (primary) hypertension: Secondary | ICD-10-CM | POA: Insufficient documentation

## 2020-11-23 DIAGNOSIS — M7918 Myalgia, other site: Secondary | ICD-10-CM | POA: Insufficient documentation

## 2020-11-23 DIAGNOSIS — T466X5A Adverse effect of antihyperlipidemic and antiarteriosclerotic drugs, initial encounter: Secondary | ICD-10-CM | POA: Insufficient documentation

## 2020-11-23 DIAGNOSIS — Z7982 Long term (current) use of aspirin: Secondary | ICD-10-CM | POA: Insufficient documentation

## 2020-11-23 DIAGNOSIS — R519 Headache, unspecified: Secondary | ICD-10-CM | POA: Insufficient documentation

## 2020-11-23 DIAGNOSIS — Z87891 Personal history of nicotine dependence: Secondary | ICD-10-CM | POA: Diagnosis not present

## 2020-11-24 ENCOUNTER — Encounter (HOSPITAL_COMMUNITY): Payer: Self-pay | Admitting: Emergency Medicine

## 2020-11-24 ENCOUNTER — Emergency Department (HOSPITAL_COMMUNITY)
Admission: EM | Admit: 2020-11-24 | Discharge: 2020-11-24 | Disposition: A | Discharge status: Home / Self Care | Payer: Managed Care, Other (non HMO) | Attending: Emergency Medicine | Admitting: Emergency Medicine

## 2020-11-24 DIAGNOSIS — T50905A Adverse effect of unspecified drugs, medicaments and biological substances, initial encounter: Secondary | ICD-10-CM

## 2020-11-24 DIAGNOSIS — R030 Elevated blood-pressure reading, without diagnosis of hypertension: Secondary | ICD-10-CM

## 2020-11-24 LAB — COMPREHENSIVE METABOLIC PANEL
ALT: 44 U/L (ref 0–44)
AST: 38 U/L (ref 15–41)
Albumin: 5 g/dL (ref 3.5–5.0)
Alkaline Phosphatase: 55 U/L (ref 38–126)
Anion gap: 13 (ref 5–15)
BUN: 16 mg/dL (ref 6–20)
CO2: 24 mmol/L (ref 22–32)
Calcium: 9.8 mg/dL (ref 8.9–10.3)
Chloride: 103 mmol/L (ref 98–111)
Creatinine, Ser: 0.99 mg/dL (ref 0.61–1.24)
GFR, Estimated: >60 mL/min (ref >60)
Glucose, Bld: 104 mg/dL — ABNORMAL HIGH (ref 70–99)
Potassium: 3.4 mmol/L — ABNORMAL LOW (ref 3.5–5.1)
Sodium: 140 mmol/L (ref 135–145)
Total Bilirubin: 0.8 mg/dL (ref 0.3–1.2)
Total Protein: 8.2 g/dL — ABNORMAL HIGH (ref 6.5–8.1)

## 2020-11-24 LAB — CBC
HCT: 45.3 % (ref 39.0–52.0)
Hemoglobin: 15.8 g/dL (ref 13.0–17.0)
MCH: 29.6 pg (ref 26.0–34.0)
MCHC: 34.9 g/dL (ref 30.0–36.0)
MCV: 84.8 fL (ref 80.0–100.0)
Platelets: 361 10*3/uL (ref 150–400)
RBC: 5.34 MIL/uL (ref 4.22–5.81)
RDW: 12.6 % (ref 11.5–15.5)
WBC: 9.7 10*3/uL (ref 4.0–10.5)
nRBC: 0 % (ref 0.0–0.2)

## 2020-11-24 LAB — CK: Total CK: 110 U/L (ref 49–397)

## 2020-11-24 LAB — LIPASE, BLOOD: Lipase: 32 U/L (ref 11–51)

## 2020-11-24 NOTE — Discharge Instructions (Addendum)
Discontinue the Zetia until you talk with your doctor.  Return to the emergency department if your symptoms change or worsen.

## 2020-11-24 NOTE — ED Provider Notes (Signed)
[facility]-EMERGENCY DEPT Provider Note   CSN: [id number] Arrival date & time: 11/23/20  2341     History Chief Complaint  Patient presents with  . Abdominal Pain    Nicholas Gillespie is a 50 y.o. male.  Patient presents to the emergency department with a chief complaint of medication reaction.  He states that he started taking Zetia about a week ago.  He reports having upset stomach, some body aches, and headache and muscle pain.  He denies any fevers or chills.  Denies any cough.  He also reports that his blood pressure was running high.  He states that it caused him to have a panic attack.  He states that he is feeling improved now.  Denies any treatments prior to arrival.  The history is provided by the patient. No language interpreter was used.       Past Medical History:  Diagnosis Date  . Coronary artery calcification seen on computed tomography   . Hyperlipidemia   . Hypertension   . Obesity     There are no problems to display for this patient.   Past Surgical History:  Procedure Laterality Date  . HERNIA REPAIR         Family History  Problem Relation Age of Onset  . Hypertension Mother   . Hyperlipidemia Mother   . Heart attack Father   . Stroke Father   . Stroke Maternal Grandmother   . CAD Maternal Grandfather   . Stroke Maternal Grandfather     Social History   Tobacco Use  . Smoking status: Former Smoker    Packs/day: 2.00    Years: 9.00    Pack years: 18.00    Types: Cigarettes    Quit date: 05/02/1999    Years since quitting: 21.5  . Smokeless tobacco: Never Used  Vaping Use  . Vaping Use: Former  . Quit date: 05/01/2012  Substance Use Topics  . Alcohol use: Yes    Comment: occasionally    Home Medications Prior to Admission medications   Medication Sig Start Date End Date Taking? Authorizing Provider  amLODipine (NORVASC) 5 MG tablet Take 5 mg by mouth daily. 11/16/20   [provider]  aspirin EC  81 MG tablet Take 1 tablet (81 mg total) by mouth daily. Swallow whole. 05/19/20   Tolia, Sunit, DO  ezetimibe (ZETIA) 10 MG tablet Take 10 mg by mouth daily. 11/16/20   [provider]  metoprolol succinate (TOPROL-XL) 50 MG 24 hr tablet Take 50 mg by mouth daily. 05/15/20   [provider]  Multiple Vitamins-Minerals (MULTIVITAMIN WITH MINERALS) tablet Take 1 tablet by mouth daily.    [provider]  Omega-3 Fatty Acids (FISH OIL) 1000 MG CAPS Take by mouth.    [provider]  polycarbophil (FIBERCON) 625 MG tablet Take 625 mg by mouth daily.    [provider]    Allergies    Pravastatin sodium and Atorvastatin  Review of Systems   Review of Systems  All other systems reviewed and are negative.   Physical Exam Updated Vital Signs BP (!) 164/100   Pulse 88   Temp 98.7 F (37.1 C) (Oral)   Resp 17   Ht 5\' 8"  (1.727 m)   Wt 104.3 kg   SpO2 98%   BMI 34.97 kg/m   Physical Exam Vitals and nursing note reviewed.  Constitutional:      Appearance: He is well-developed and well-nourished.  HENT:  Head: Normocephalic and atraumatic.  Eyes:     Conjunctiva/sclera: Conjunctivae normal.  Cardiovascular:     Rate and Rhythm: Normal rate and regular rhythm.     Heart sounds: No murmur heard.   Pulmonary:     Effort: Pulmonary effort is normal. No respiratory distress.     Breath sounds: Normal breath sounds.  Abdominal:     Palpations: Abdomen is soft.     Tenderness: There is no abdominal tenderness.  Musculoskeletal:        General: No edema. Normal range of motion.     Cervical back: Neck supple.  Skin:    General: Skin is warm and dry.  Neurological:     Mental Status: He is alert and oriented to person, place, and time.  Psychiatric:        Mood and Affect: Mood and affect and mood normal.        Behavior: Behavior normal.     ED Results / Procedures / Treatments   Labs (all labs ordered are listed, but only  abnormal results are displayed) Labs Reviewed  CBC  LIPASE, BLOOD  COMPREHENSIVE METABOLIC PANEL  URINALYSIS, ROUTINE W REFLEX MICROSCOPIC  CK    EKG None  Radiology No results found.  Procedures Procedures   Medications Ordered in ED Medications - No data to display  ED Course  I have reviewed the triage vital signs and the nursing notes.  Pertinent labs & imaging results that were available during my care of the patient were reviewed by me and considered in my medical decision making (see chart for details).    MDM Rules/Calculators/A&P                          Patient here with muscle aches, body aches, upset stomach which he attributes to taking Zetia.  Denies any fevers or chills.  Labs are pending.  Will reassess.  Patient still feeling improved.  Laboratory work-up is reassuring, potassium is barely low at 3.4.  CK is normal.  CBC normal.  We will plan for discharge.  Recommend follow-up with PCP.  Patient understands and agrees with the plan.    Final Clinical Impression(s) / ED Diagnoses Final diagnoses:  Adverse effect of drug, initial encounter  Elevated blood pressure reading    Rx / DC Orders ED Discharge Orders    None       Roxy Horseman, PA-C 11/24/20 0230    Molpus, Jonny Ruiz, MD 11/24/20 [phone]

## 2020-11-24 NOTE — ED Notes (Signed)
Extra tubes to lab:  Dark green blue

## 2020-11-24 NOTE — ED Triage Notes (Signed)
Patient is complaining of having side effects from zetia. Stomach being upset, bone pain, headache, and etc. Patient states this started today. Patient started zetia 10 mg on Jan. 28, 2022.

## 2020-12-05 ENCOUNTER — Encounter: Payer: Self-pay | Admitting: Cardiology

## 2020-12-05 ENCOUNTER — Other Ambulatory Visit: Payer: Self-pay

## 2020-12-05 ENCOUNTER — Ambulatory Visit: Payer: Managed Care, Other (non HMO) | Admitting: Cardiology

## 2020-12-05 VITALS — BP 138/87 | HR 80 | Temp 97.8°F | Resp 16 | Ht 68.0 in | Wt 228.0 lb

## 2020-12-05 DIAGNOSIS — E66811 Obesity, class 1: Secondary | ICD-10-CM

## 2020-12-05 DIAGNOSIS — I2510 Atherosclerotic heart disease of native coronary artery without angina pectoris: Secondary | ICD-10-CM

## 2020-12-05 DIAGNOSIS — E6609 Other obesity due to excess calories: Secondary | ICD-10-CM

## 2020-12-05 DIAGNOSIS — Z87891 Personal history of nicotine dependence: Secondary | ICD-10-CM

## 2020-12-05 DIAGNOSIS — Z789 Other specified health status: Secondary | ICD-10-CM

## 2020-12-05 DIAGNOSIS — I10 Essential (primary) hypertension: Secondary | ICD-10-CM

## 2020-12-05 DIAGNOSIS — E7800 Pure hypercholesterolemia, unspecified: Secondary | ICD-10-CM

## 2020-12-05 MED ORDER — SIMVASTATIN 40 MG PO TABS: 40.0000 mg | ORAL_TABLET | Freq: Every day | ORAL | 0 refills | Status: DC

## 2020-12-05 NOTE — Progress Notes (Signed)
Date:  12/05/2020   ID:  [name], DOB 1971-04-04, MRN [id number]  PCP:  Deland Pretty, MD  Cardiologist:  Rex Kras, DO, [facility] (established care 05/01/2020)  Date: 12/05/20 Last Office Visit: 11/20/2020  Chief Complaint  Patient presents with  . Coronary Artery Disease  . Follow-up  . Medication Management    HPI  Nicholas Gillespie is a 50 y.o. male who presents to the office with a chief complaint of "history of coronary calcifications and lipid management". Patient's past medical history and cardiovascular risk factors include: Hypertension, former smoker, coronary artery calcification (115 AU), obesity due to excess calories.  Patient is referred to the office at the request of his primary care provider for evaluation of chest pain back in July 2021.  In the past patient has had atypical chest pain and therefore underwent cardiac evaluation.  He was noted to have moderate coronary artery calcification and therefore was recommended to be on aspirin and statin therapy.  He has started aspirin 81 mg p.o. daily without any side effects or intolerances.  Prior to establishing care with myself patient states that he has been tried on multiple statin therapies but has been intolerant to them.  He was than transitioned to [facility] by PCP which was appropriate but now presents back for further recommendations as Zeita also caused " an allergic reaction (headache, joint and muscle pain, anxiety, depression, stomach pain)."  Because the symptoms caused by Zetia patient went to the emergency room department on November 23, 2020.  Lab work independently reviewed notes stable AST and ALT levels and normal CK levels.  He is now referred to [place] for lipid management.  Patient is informed that his symptoms are nonspecific and less likely due to Zetia.  However he feels uncomfortable restarting Zetia at this time.  Patient states that he has tried Crestor, pravastatin, and Lipitor in the past as well  he has been intolerant to the statin medications.  FUNCTIONAL STATUS: Walking 30-35 minutes per day 4x per week.   ALLERGIES: Allergies  Allergen Reactions  . Pravastatin Sodium     Other reaction(s): memory loss  . Atorvastatin Other (See Comments)    Liver enzymes increased     MEDICATION LIST PRIOR TO VISIT: Current Meds  Medication Sig  . ALPRAZolam (XANAX) 0.5 MG tablet as needed.  Marland Kitchen amLODipine (NORVASC) 5 MG tablet Take 5 mg by mouth daily.  Marland Kitchen aspirin EC 81 MG tablet Take 1 tablet (81 mg total) by mouth daily. Swallow whole.  . metoprolol succinate (TOPROL-XL) 50 MG 24 hr tablet Take 50 mg by mouth daily.  . Multiple Vitamins-Minerals (MULTIVITAMIN WITH MINERALS) tablet Take 1 tablet by mouth daily.  . Omega-3 Fatty Acids (FISH OIL) 1000 MG CAPS Take by mouth.  . polycarbophil (FIBERCON) 625 MG tablet Take 625 mg by mouth daily.  . simvastatin (ZOCOR) 40 MG tablet Take 1 tablet (40 mg total) by mouth at bedtime.     PAST MEDICAL HISTORY: Past Medical History:  Diagnosis Date  . Coronary artery calcification seen on computed tomography   . Hyperlipidemia   . Hypertension   . Obesity     PAST SURGICAL HISTORY: Past Surgical History:  Procedure Laterality Date  . HERNIA REPAIR      FAMILY HISTORY: The patient family history includes CAD in his maternal grandfather; Heart attack in his father; Hyperlipidemia in his mother; Hypertension in his mother; Stroke in his father, maternal grandfather, and maternal grandmother.  SOCIAL HISTORY:  The patient  reports that he quit smoking about 21 years ago. His smoking use included cigarettes. He has a 18.00 pack-year smoking history. He has never used smokeless tobacco. He reports current alcohol use.  REVIEW OF SYSTEMS: Review of Systems  Constitutional: Negative for chills and fever.  HENT: Negative for hoarse voice and nosebleeds.   Eyes: Negative for discharge, double vision and pain.  Cardiovascular: Negative for  chest pain, claudication, dyspnea on exertion, leg swelling, near-syncope, orthopnea, palpitations, paroxysmal nocturnal dyspnea and syncope.  Respiratory: Negative for hemoptysis and shortness of breath.   Musculoskeletal: Negative for muscle cramps and myalgias.  Gastrointestinal: Negative for abdominal pain, constipation, diarrhea, hematemesis, hematochezia, melena, nausea and vomiting.  Neurological: Negative for dizziness and light-headedness.   PHYSICAL EXAM: Vitals with BMI 12/05/2020 12/05/2020 11/24/2020  Height - _0  -  Weight - 228 lbs -  BMI - 02.58 -  Systolic 527 782 423  Diastolic 87 91 536  Pulse 80 80 96    CONSTITUTIONAL: Well-developed and well-nourished. No acute distress.  SKIN: Skin is warm and dry. No rash noted. No cyanosis. No pallor. No jaundice HEAD: Normocephalic and atraumatic.  EYES: No scleral icterus MOUTH/THROAT: Moist oral membranes.  NECK: No JVD present. No thyromegaly noted. No carotid bruits  LYMPHATIC: No visible cervical adenopathy.  CHEST Normal respiratory effort. No intercostal retractions  LUNGS: Clear to auscultation bilaterally.  No stridor. No wheezes. No rales.  CARDIOVASCULAR: Regular rate and rhythm, positive S1-S2, no murmurs rubs or gallops appreciated ABDOMINAL: Obese, soft, nontender, nondistended, positive bowel sounds in all 4 quadrants.  No apparent ascites.  EXTREMITIES: No peripheral edema.  2+ bilateral dorsalis pedis and posterior tibial pulses HEMATOLOGIC: No significant bruising NEUROLOGIC: Oriented to person, place, and time. Nonfocal. Normal muscle tone.  PSYCHIATRIC: Normal mood and affect. Normal behavior. Cooperative  CARDIAC DATABASE: EKG: 11/20/2020: Normal sinus rhythm, normal axis, poor R wave progression, without underlying injury pattern.    Echocardiogram: 05/05/2020: Hyperdynamic LV systolic function with visual EF >70%. Left ventricle cavity is normal in size. Mild left ventricular hypertrophy. Normal  global wall motion. Normal diastolic filling pattern, normal LAP. Calculated EF 63%. Mild (Grade I) mitral regurgitation.  Stress Testing: Exercise treadmill stress test 05/08/2020:  Exercise treadmill stress test performed using Bruce protocol. Patient reached 10.9 METS, and 108% of age predicted maximum heart rate. Exercise capacity was excellent. No chest pain reported. Normal heart rate and hemodynamic response. Stress EKG revealed no ischemic changes.  Low risk study.   Coronary artery calcification scoring performed on 05/12/2020 at [facility]:  Left main: 6  Left anterior descending 34  Left circumflex: 0  Right coronary artery: 75  Posterior descending artery: 0  Total calcium score: 115 AU, moderate calcified coronary artery plaque putting the patient just below the 90th percentile for age.  Heart Catheterization: None  LABORATORY DATA: CBC Latest Ref Rng & Units 11/23/2020 04/23/2020  WBC 4.0 - 10.5 K/uL 9.7 7.6  Hemoglobin 13.0 - 17.0 g/dL 15.8 16.2  Hematocrit 39.0 - 52.0 % 45.3 46.7  Platelets 150 - 400 K/uL 361 321    CMP Latest Ref Rng & Units 11/23/2020 04/23/2020  Glucose 70 - 99 mg/dL 104(H) 101(H)  BUN 6 - 20 mg/dL 16 16  Creatinine 0.61 - 1.24 mg/dL 0.99 1.34(H)  Sodium 135 - 145 mmol/L 140 139  Potassium 3.5 - 5.1 mmol/L 3.4(L) 3.9  Chloride 98 - 111 mmol/L 103 103  CO2 22 - 32 mmol/L 24 27  Calcium  8.9 - 10.3 mg/dL 9.8 9.7  Total Protein 6.5 - 8.1 g/dL 8.2(H) -  Total Bilirubin 0.3 - 1.2 mg/dL 0.8 -  Alkaline Phos 38 - 126 U/L 55 -  AST 15 - 41 U/L 38 -  ALT 0 - 44 U/L 44 -    External Labs: Collected: 04/27/2020 Hemoglobin: 15.4 g/dL Creatinine 1.27 mg/dL. eGFR: 67/78 mL/min per 1.73 m Lipid profile: Total cholesterol 286, triglycerides 183, HDL 37, LDL 214 TSH: 3.15   IMPRESSION:    ICD-10-CM   1. Coronary atherosclerosis due to calcified coronary lesion of native artery  I25.10 simvastatin (ZOCOR) 40 MG tablet   I25.84 Lipid Panel With  LDL/HDL Ratio    LDL cholesterol, direct    CK (Creatine Kinase)    CMP14+EGFR  2. Benign hypertension  I10   3. Statin intolerance  Z78.9   4. Former smoker  Z87.891   5. Pure hypercholesterolemia  E78.00 simvastatin (ZOCOR) 40 MG tablet    Lipid Panel With LDL/HDL Ratio    LDL cholesterol, direct    CK (Creatine Kinase)    CMP14+EGFR  6. Class 1 obesity due to excess calories without serious comorbidity with body mass index (BMI) of 33.0 to 33.9 in adult  E66.09    Z68.33      RECOMMENDATIONS: Dain Laseter is a 50 y.o. male whose past medical history and cardiac risk factors include: Benign essential hypertension, former smoker, obesity due to excess calories.  Coronary artery calcification:  Moderate coronary artery calcification, total coronary calcium score 115 AU.  Continue aspirin 81 mg p.o. daily.  Prior to establishing care with myself patient has been intolerant to the following statin medications however prescribed by his primary care provider.  He has tried Crestor, Lipitor, and pravastatin in the past.  He was started on Zetia but claims to be allergic to it.  I did inform him that symptoms from Zetia are not true allergies but nonspecific findings.  Patient states that he feels uncomfortable restarting another trial of Zetia at this time.    He is willing to start simvastatin 40 mg p.o. nightly.  Medication profile discussed.  Will follow closely.    Follow-up in 6 weeks to reevaluate lipid profile and AST/ALT and CK levels.   Patient is asked to call sooner if he develops symptoms that suggest either an intolerant or allergic reaction to simvastatin.  Benign essential hypertension:   Office blood pressure is within acceptable range but not at goal.    Recommended goal SBP around 15mHg as he in non-diabetic.   Blood pressure log reviewed.  Currently managed by primary care provider.    Hyperlipidemia, pure:   Most recent blood work from July 2021  notes a LDL of 214 mg/dL.  Has been intolerant to multiple statin medications discussed above.  Is willing to try simvastatin 40 mg p.o. nightly for 6 weeks and re-evaluate.   Continue to monitor.  Former smoker: Educated on the importance of continued smoking cessation.  Obesity, due to excess calories: Body mass index is 34.67 kg/m. . I reviewed with the patient the importance of diet, regular physical activity/exercise, weight loss.   . Patient is educated on increasing physical activity gradually as tolerated.  With the goal of moderate intensity exercise for 30 minutes a day 5 days a week.  FINAL MEDICATION LIST END OF ENCOUNTER: Meds ordered this encounter  Medications  . simvastatin (ZOCOR) 40 MG tablet    Sig: Take 1 tablet (40 mg  total) by mouth at bedtime.    Dispense:  60 tablet    Refill:  0     Current Outpatient Medications:  .  ALPRAZolam (XANAX) 0.5 MG tablet, as needed., Disp: , Rfl:  .  amLODipine (NORVASC) 5 MG tablet, Take 5 mg by mouth daily., Disp: , Rfl:  .  aspirin EC 81 MG tablet, Take 1 tablet (81 mg total) by mouth daily. Swallow whole., Disp: 90 tablet, Rfl: 3 .  metoprolol succinate (TOPROL-XL) 50 MG 24 hr tablet, Take 50 mg by mouth daily., Disp: , Rfl:  .  Multiple Vitamins-Minerals (MULTIVITAMIN WITH MINERALS) tablet, Take 1 tablet by mouth daily., Disp: , Rfl:  .  Omega-3 Fatty Acids (FISH OIL) 1000 MG CAPS, Take by mouth., Disp: , Rfl:  .  polycarbophil (FIBERCON) 625 MG tablet, Take 625 mg by mouth daily., Disp: , Rfl:  .  simvastatin (ZOCOR) 40 MG tablet, Take 1 tablet (40 mg total) by mouth at bedtime., Disp: 60 tablet, Rfl: 0  Orders Placed This Encounter  Procedures  . Lipid Panel With LDL/HDL Ratio  . LDL cholesterol, direct  . CK (Creatine Kinase)  . CMP14+EGFR    There are no Patient Instructions on file for this visit.   --Continue cardiac medications as reconciled in final medication list. --Return in about 7 weeks (around  01/22/2021) for Follow up, Lipid. Or sooner if needed. --Continue follow-up with your primary care physician regarding the management of your other chronic comorbid conditions.  Patient's questions and concerns were addressed to his satisfaction. He voices understanding of the instructions provided during this encounter.   This note was created using a voice recognition software as a result there may be grammatical errors inadvertently enclosed that do not reflect the nature of this encounter. Every attempt is made to correct such errors.  Rex Kras, [place], [facility]  Pager: [phone] Office: [phone]

## 2021-01-16 LAB — CMP14+EGFR
ALT: 43 IU/L (ref 0–44)
AST: 33 IU/L (ref 0–40)
Albumin/Globulin Ratio: 2 (ref 1.2–2.2)
Albumin: 4.9 g/dL (ref 4.0–5.0)
Alkaline Phosphatase: 70 IU/L (ref 44–121)
BUN/Creatinine Ratio: 15 (ref 9–20)
BUN: 15 mg/dL (ref 6–24)
Bilirubin Total: 0.5 mg/dL (ref 0.0–1.2)
CO2: 23 mmol/L (ref 20–29)
Calcium: 10.2 mg/dL (ref 8.7–10.2)
Chloride: 100 mmol/L (ref 96–106)
Creatinine, Ser: 0.99 mg/dL (ref 0.76–1.27)
Globulin, Total: 2.4 g/dL (ref 1.5–4.5)
Glucose: 96 mg/dL (ref 65–99)
Potassium: 4.8 mmol/L (ref 3.5–5.2)
Sodium: 140 mmol/L (ref 134–144)
Total Protein: 7.3 g/dL (ref 6.0–8.5)
eGFR: 95 mL/min/{1.73_m2} (ref >59)

## 2021-01-16 LAB — LIPID PANEL WITH LDL/HDL RATIO
Cholesterol, Total: 190 mg/dL (ref 100–199)
HDL: 45 mg/dL (ref >39)
LDL Chol Calc (NIH): 112 mg/dL — ABNORMAL HIGH (ref 0–99)
LDL/HDL Ratio: 2.5 ratio (ref 0.0–3.6)
Triglycerides: 191 mg/dL — ABNORMAL HIGH (ref 0–149)
VLDL Cholesterol Cal: 33 mg/dL (ref 5–40)

## 2021-01-16 LAB — LDL CHOLESTEROL, DIRECT: LDL Direct: 110 mg/dL — ABNORMAL HIGH (ref 0–99)

## 2021-01-16 LAB — CK: Total CK: 89 U/L (ref 49–439)

## 2021-01-18 NOTE — Progress Notes (Signed)
Spoke to patient he is aware

## 2021-01-23 ENCOUNTER — Ambulatory Visit: Payer: Managed Care, Other (non HMO) | Admitting: Student

## 2021-01-26 ENCOUNTER — Ambulatory Visit: Payer: Managed Care, Other (non HMO) | Admitting: Student

## 2021-01-26 ENCOUNTER — Other Ambulatory Visit: Payer: Self-pay | Admitting: Cardiology

## 2021-01-26 ENCOUNTER — Other Ambulatory Visit: Payer: Self-pay

## 2021-01-26 ENCOUNTER — Encounter: Payer: Self-pay | Admitting: Student

## 2021-01-26 VITALS — BP 132/84 | HR 87 | Temp 98.3°F | Resp 17 | Ht 68.0 in | Wt 226.8 lb

## 2021-01-26 DIAGNOSIS — Z789 Other specified health status: Secondary | ICD-10-CM

## 2021-01-26 DIAGNOSIS — E7800 Pure hypercholesterolemia, unspecified: Secondary | ICD-10-CM

## 2021-01-26 DIAGNOSIS — I2584 Coronary atherosclerosis due to calcified coronary lesion: Secondary | ICD-10-CM

## 2021-01-26 DIAGNOSIS — I2510 Atherosclerotic heart disease of native coronary artery without angina pectoris: Secondary | ICD-10-CM

## 2021-01-26 DIAGNOSIS — I10 Essential (primary) hypertension: Secondary | ICD-10-CM

## 2021-01-26 MED ORDER — BEMPEDOIC ACID 180 MG PO TABS: 180.0000 mg | ORAL_TABLET | Freq: Every day | ORAL | 3 refills | Status: DC

## 2021-01-26 NOTE — Progress Notes (Signed)
Date:  01/31/2021   ID:  [name], DOB [birth year], MRN [id number]  PCP:  Deland Pretty, MD  Cardiologist:  Alethia Berthold, DO, [facility] (established care 05/01/2020)  Date: 01/31/21 Last Office Visit: 11/20/2020  Chief Complaint  Patient presents with  . Follow-up    7 WEEKS  . Hyperlipidemia    HPI  Nicholas Gillespie is a 50 y.o. male who presents to the office with a chief complaint of "history of coronary calcifications and lipid management". Patient's past medical history and cardiovascular risk factors include: Hypertension, former smoker, coronary artery calcification (115 AU), obesity due to excess calories.  Patient was referred to the office at the request of his primary care provider for evaluation of chest pain back in July 2021.  He was then referred back to our office for evaluation and management of hyperlipidemia in February 2022.  Patient presents for 7 week follow up for management of hyperlipidemia. At last visit patient started simvastatin 40 mg nightly. Upon repeat lipid panel LDL had improved from 214 to 110 mg/dL and triglycerides remain above goal.  Patient is tolerating simvastatin 40 mg nightly without issue and CK and liver enzymes remain normal.  Patient admits to dietary indiscretion including eating high salt and fat foods.  Patient has previously undergone cardiac evaluation due to atypical chest pain which revealed moderate coronary artery calcification, and patient was started on aspirin and statin therapy.  Patient reports intolerance to multiple statin therapies including Crestor, pravastatin, and Lipitor.  He is also previously tried Zetia which according to patient caused headache, joint pain, nausea therefore he is not willing to try it again.  In view of patient's continued elevated LDL and triglycerides as well as known coronary artery calcification, discussed at length with patient regarding aggressive lipid management strategies including  PCSK9 inhibitor.  Patient is overall hesitant regarding medications, particularly injectables as he worries about side effects. Discussed with patient side effects, risks, and benefits of PCSK9 inhibitors, however he prefers to avoid use of these medications. Discussed with patient option of vescepa and bempedoic acid, patient is willing to try bempedoic acid.   In regard to hypertension, patient reports home blood pressure readings average 125/83 mmHg, unfortunately he does not bring with him a written log for review.   FUNCTIONAL STATUS: Walking 30-35 minutes per day 4x per week.   ALLERGIES: Allergies  Allergen Reactions  . Ezetimibe Rash  . Pravastatin Sodium     Other reaction(s): memory loss  . Atorvastatin Other (See Comments)    Liver enzymes increased     MEDICATION LIST PRIOR TO VISIT: Current Meds  Medication Sig  . ALPRAZolam (XANAX) 0.5 MG tablet as needed.  Marland Kitchen amLODipine (NORVASC) 5 MG tablet Take 5 mg by mouth daily.  Marland Kitchen aspirin EC 81 MG tablet Take 1 tablet (81 mg total) by mouth daily. Swallow whole.  . Bempedoic Acid 180 MG TABS Take 180 mg by mouth daily.  Marland Kitchen co-enzyme Q-10 50 MG capsule Take 200 mg by mouth daily.  . Magnesium 200 MG CHEW Chew 250 mg by mouth daily.  . metoprolol succinate (TOPROL-XL) 50 MG 24 hr tablet Take 50 mg by mouth daily.  . Multiple Vitamins-Minerals (MULTIVITAMIN WITH MINERALS) tablet Take 1 tablet by mouth daily.  . Omega-3 Fatty Acids (FISH OIL) 1000 MG CAPS Take by mouth.  . polycarbophil (FIBERCON) 625 MG tablet Take 625 mg by mouth daily.  . simvastatin (ZOCOR) 40 MG tablet TAKE 1 TABLET BY MOUTH  EVERYDAY AT BEDTIME     PAST MEDICAL HISTORY: Past Medical History:  Diagnosis Date  . Coronary artery calcification seen on computed tomography   . Hyperlipidemia   . Hypertension   . Obesity     PAST SURGICAL HISTORY: Past Surgical History:  Procedure Laterality Date  . HERNIA REPAIR      FAMILY HISTORY: The patient family  history includes CAD in his maternal grandfather; Heart attack in his father; Hyperlipidemia in his mother; Hypertension in his mother; Stroke in his father, maternal grandfather, and maternal grandmother.  SOCIAL HISTORY:  The patient  reports that he quit smoking about 21 years ago. His smoking use included cigarettes. He has a 18.00 pack-year smoking history. He has never used smokeless tobacco. He reports previous alcohol use. He reports previous drug use.  REVIEW OF SYSTEMS: Review of Systems  Constitutional: Negative for malaise/fatigue and weight gain.  Cardiovascular: Negative for chest pain, claudication, dyspnea on exertion, leg swelling, near-syncope, orthopnea, palpitations, paroxysmal nocturnal dyspnea and syncope.  Respiratory: Negative for hemoptysis and shortness of breath.   Hematologic/Lymphatic: Does not bruise/bleed easily.  Musculoskeletal: Negative for muscle cramps and myalgias.  Gastrointestinal: Negative for abdominal pain, constipation, diarrhea, hematemesis, hematochezia, melena, nausea and vomiting.  Neurological: Negative for dizziness, light-headedness and weakness.   PHYSICAL EXAM: Vitals with BMI 01/26/2021 12/05/2020 12/05/2020  Height 5' 8"  - 5' 8"   Weight 226 lbs 13 oz - 228 lbs  BMI 59.16 - 38.46  Systolic 659 935 701  Diastolic 84 87 91  Pulse 87 80 80    CONSTITUTIONAL: Well-developed and well-nourished. No acute distress.  SKIN: Skin is warm and dry. No rash noted. No cyanosis. No pallor. No jaundice HEAD: Normocephalic and atraumatic.  EYES: No scleral icterus MOUTH/THROAT: Moist oral membranes.  NECK: No JVD present. No thyromegaly noted. No carotid bruits  LYMPHATIC: No visible cervical adenopathy.  CHEST Normal respiratory effort. No intercostal retractions  LUNGS: Clear to auscultation bilaterally.  No stridor. No wheezes. No rales.  CARDIOVASCULAR: Regular rate and rhythm, positive S1-S2, no murmurs rubs or gallops appreciated ABDOMINAL:  Obese, soft, nontender, nondistended, positive bowel sounds in all 4 quadrants.  No apparent ascites.  EXTREMITIES: No peripheral edema. HEMATOLOGIC: No significant bruising NEUROLOGIC: Oriented to person, place, and time. Nonfocal. Normal muscle tone.  PSYCHIATRIC: Normal mood and affect. Normal behavior. Cooperative  CARDIAC DATABASE: EKG: 11/20/2020: Normal sinus rhythm, normal axis, poor R wave progression, without underlying injury pattern.    Echocardiogram: 05/05/2020: Hyperdynamic LV systolic function with visual EF >70%. Left ventricle cavity is normal in size. Mild left ventricular hypertrophy. Normal global wall motion. Normal diastolic filling pattern, normal LAP. Calculated EF 63%. Mild (Grade I) mitral regurgitation.  Stress Testing: Exercise treadmill stress test 05/08/2020:  Exercise treadmill stress test performed using Bruce protocol. Patient reached 10.9 METS, and 108% of age predicted maximum heart rate. Exercise capacity was excellent. No chest pain reported. Normal heart rate and hemodynamic response. Stress EKG revealed no ischemic changes.  Low risk study.   Coronary artery calcification scoring performed on 05/12/2020 at [facility]:  Left main: 6  Left anterior descending 34  Left circumflex: 0  Right coronary artery: 75  Posterior descending artery: 0  Total calcium score: 115 AU, moderate calcified coronary artery plaque putting the patient just below the 90th percentile for age.  Heart Catheterization: None  LABORATORY DATA: CBC Latest Ref Rng & Units 11/23/2020 04/23/2020  WBC 4.0 - 10.5 K/uL 9.7 7.6  Hemoglobin 13.0 - 17.0  g/dL 15.8 16.2  Hematocrit 39.0 - 52.0 % 45.3 46.7  Platelets 150 - 400 K/uL 361 321    CMP Latest Ref Rng & Units 01/15/2021 11/23/2020 04/23/2020  Glucose 65 - 99 mg/dL 96 104(H) 101(H)  BUN 6 - 24 mg/dL 15 16 16   Creatinine 0.76 - 1.27 mg/dL 0.99 0.99 1.34(H)  Sodium 134 - 144 mmol/L 140 140 139  Potassium 3.5 - 5.2 mmol/L 4.8  3.4(L) 3.9  Chloride 96 - 106 mmol/L 100 103 103  CO2 20 - 29 mmol/L 23 24 27   Calcium 8.7 - 10.2 mg/dL 10.2 9.8 9.7  Total Protein 6.0 - 8.5 g/dL 7.3 8.2(H) -  Total Bilirubin 0.0 - 1.2 mg/dL 0.5 0.8 -  Alkaline Phos 44 - 121 IU/L 70 55 -  AST 0 - 40 IU/L 33 38 -  ALT 0 - 44 IU/L 43 44 -   Lab Results  Component Value Date   CHOL 190 01/15/2021   HDL 45 01/15/2021   LDLCALC 112 (H) 01/15/2021   LDLDIRECT 110 (H) 01/15/2021   TRIG 191 (H) 01/15/2021   External Labs: Collected: 04/27/2020 Hemoglobin: 15.4 g/dL Creatinine 1.27 mg/dL. eGFR: 67/78 mL/min per 1.73 m Lipid profile: Total cholesterol 286, triglycerides 183, HDL 37, LDL 214 TSH: 3.15   IMPRESSION:    ICD-10-CM   1. Pure hypercholesterolemia  E78.00 Lipid Panel With LDL/HDL Ratio    Direct LDL    Direct LDL    Lipid Panel With LDL/HDL Ratio  2. Coronary atherosclerosis due to calcified coronary lesion of native artery  I25.10 Lipid Panel With LDL/HDL Ratio   I25.84 Direct LDL    Direct LDL    Lipid Panel With LDL/HDL Ratio  3. Statin intolerance  Z78.9 Lipid Panel With LDL/HDL Ratio    Direct LDL    Direct LDL    Lipid Panel With LDL/HDL Ratio  4. Benign hypertension  I10      RECOMMENDATIONS: Nicholas Gillespie is a 50 y.o. male whose past medical history and cardiac risk factors include: Benign essential hypertension, former smoker, obesity due to excess calories.  Coronary artery calcification:  Moderate coronary artery calcification, total coronary calcium score 115 AU.  Continue aspirin 81 mg p.o. daily.  In the past patient has tried Crestor, Lipitor, pravastatin, and Zetia all of which she was unable to tolerate.  Patient is hesitant to initiate new medications, and prefers to avoid medications in general.  However he is willing to continue simvastatin as he is tolerating this well.  Continue simvastatin 40 mg p.o. nightly  In view of continued elevation of LDL and triglycerides shared decision  was to add bempedoic acid 180 mg daily   We will repeat lipid profile testing in 8 weeks.  Will reevaluate lipid control at that time, I have encouraged patient to consider PCSK9 inhibitor in the future.  Patient will notify our office if he experiences symptoms suggestive of intolerance to bempedoic acid.  Benign essential hypertension:   Patient's blood pressure in the office is mildly elevated, recommended goal is systolic blood pressure of 120 mmHg.  However patient reports home blood pressure readings which are better controlled.  Will not make changes to his medications at this time, will defer further management to PCP.  Encourage patient to keep a written log of home blood pressure readings and bring with him to future appointments.  Hyperlipidemia, pure:   LDL improved from 214 to 110 mg/dL   Patient has history of intolerance to multiple  statins as well as Zetia.  He is tolerating simvastatin without issue.  Continue simvastatin 40 mg nightly  Patient does not wish to start PCSK9 inhibitor, as he is particularly hesitant to start injectable medication.  Shared decision was to proceed with starting bempedoic acid as well as for patient to make significant diet and lifestyle modifications.  Start bempedoic acid 180 mg daily  Repeat lipid profile testing in 8 weeks.  Obesity, due to excess calories: Body mass index is 34.48 kg/m. Marland Kitchen Again discussed with patient the importance of diet and lifestyle modifications in order to lose weight and reduce overall cardiovascular risk.  FINAL MEDICATION LIST END OF ENCOUNTER: Meds ordered this encounter  Medications  . Bempedoic Acid 180 MG TABS    Sig: Take 180 mg by mouth daily.    Dispense:  90 tablet    Refill:  3     Current Outpatient Medications:  .  ALPRAZolam (XANAX) 0.5 MG tablet, as needed., Disp: , Rfl:  .  amLODipine (NORVASC) 5 MG tablet, Take 5 mg by mouth daily., Disp: , Rfl:  .  aspirin EC 81 MG tablet, Take 1  tablet (81 mg total) by mouth daily. Swallow whole., Disp: 90 tablet, Rfl: 3 .  Bempedoic Acid 180 MG TABS, Take 180 mg by mouth daily., Disp: 90 tablet, Rfl: 3 .  co-enzyme Q-10 50 MG capsule, Take 200 mg by mouth daily., Disp: , Rfl:  .  Magnesium 200 MG CHEW, Chew 250 mg by mouth daily., Disp: , Rfl:  .  metoprolol succinate (TOPROL-XL) 50 MG 24 hr tablet, Take 50 mg by mouth daily., Disp: , Rfl:  .  Multiple Vitamins-Minerals (MULTIVITAMIN WITH MINERALS) tablet, Take 1 tablet by mouth daily., Disp: , Rfl:  .  Omega-3 Fatty Acids (FISH OIL) 1000 MG CAPS, Take by mouth., Disp: , Rfl:  .  polycarbophil (FIBERCON) 625 MG tablet, Take 625 mg by mouth daily., Disp: , Rfl:  .  simvastatin (ZOCOR) 40 MG tablet, TAKE 1 TABLET BY MOUTH EVERYDAY AT BEDTIME, Disp: 60 tablet, Rfl: 0  Orders Placed This Encounter  Procedures  . Lipid Panel With LDL/HDL Ratio  . Direct LDL    There are no Patient Instructions on file for this visit.   --Continue cardiac medications as reconciled in final medication list. --Return in about 6 months (around 07/28/2021) for HLD . Or sooner if needed. --Continue follow-up with your primary care physician regarding the management of your other chronic comorbid conditions.  Patient's questions and concerns were addressed to his satisfaction. He voices understanding of the instructions provided during this encounter.    Alethia Berthold, PA-C 01/31/2021, 2:01 PM Office: [phone]

## 2021-02-16 ENCOUNTER — Other Ambulatory Visit: Payer: Self-pay

## 2021-03-19 ENCOUNTER — Other Ambulatory Visit: Payer: Self-pay | Admitting: Cardiology

## 2021-03-19 DIAGNOSIS — I2510 Atherosclerotic heart disease of native coronary artery without angina pectoris: Secondary | ICD-10-CM

## 2021-03-19 DIAGNOSIS — E7800 Pure hypercholesterolemia, unspecified: Secondary | ICD-10-CM

## 2021-04-18 ENCOUNTER — Other Ambulatory Visit: Payer: Self-pay | Admitting: Internal Medicine

## 2021-04-18 DIAGNOSIS — R1011 Right upper quadrant pain: Secondary | ICD-10-CM

## 2021-05-17 ENCOUNTER — Inpatient Hospital Stay: Admission: RE | Admit: 2021-05-17 | Payer: Managed Care, Other (non HMO) | Source: Ambulatory Visit

## 2021-06-06 ENCOUNTER — Other Ambulatory Visit: Payer: Self-pay | Admitting: Physician Assistant

## 2021-06-06 DIAGNOSIS — R1011 Right upper quadrant pain: Secondary | ICD-10-CM

## 2021-06-18 ENCOUNTER — Ambulatory Visit
Admission: RE | Admit: 2021-06-18 | Discharge: 2021-06-18 | Disposition: A | Discharge status: Home / Self Care | Payer: Managed Care, Other (non HMO) | Source: Ambulatory Visit | Attending: Physician Assistant | Admitting: Physician Assistant

## 2021-06-18 DIAGNOSIS — R1011 Right upper quadrant pain: Secondary | ICD-10-CM

## 2021-07-21 HISTORY — PX: COLONOSCOPY WITH ESOPHAGOGASTRODUODENOSCOPY (EGD) AND ESOPHAGEAL DILATION (ED): SHX6495

## 2021-07-30 ENCOUNTER — Ambulatory Visit: Payer: Managed Care, Other (non HMO) | Admitting: Student

## 2021-07-30 ENCOUNTER — Other Ambulatory Visit: Payer: Self-pay

## 2021-07-30 ENCOUNTER — Encounter: Payer: Self-pay | Admitting: Student

## 2021-07-30 VITALS — BP 134/83 | HR 77 | Temp 97.9°F | Ht 68.0 in | Wt 225.0 lb

## 2021-07-30 DIAGNOSIS — I2510 Atherosclerotic heart disease of native coronary artery without angina pectoris: Secondary | ICD-10-CM

## 2021-07-30 DIAGNOSIS — E7800 Pure hypercholesterolemia, unspecified: Secondary | ICD-10-CM

## 2021-07-30 DIAGNOSIS — I2584 Coronary atherosclerosis due to calcified coronary lesion: Secondary | ICD-10-CM

## 2021-07-30 DIAGNOSIS — I10 Essential (primary) hypertension: Secondary | ICD-10-CM

## 2021-07-30 DIAGNOSIS — Z789 Other specified health status: Secondary | ICD-10-CM

## 2021-07-30 NOTE — Progress Notes (Signed)
Date:  07/30/2021   ID:  Nicholas Gillespie, DOB [birth year], MRN [id number]  PCP:  Deland Pretty, MD  Cardiologist:  Alethia Berthold, DO, [facility] (established care 05/01/2020)  Date: 07/30/21 Last Office Visit: 11/20/2020  Chief Complaint  Patient presents with   Coronary Artery Disease   Follow-up    HPI  Nicholas Gillespie is a 50 y.o. male who presents to the office with a chief complaint of "history of coronary calcifications and lipid management". Patient's past medical history and cardiovascular risk factors include: Hypertension, former smoker, coronary artery calcification (115 AU), obesity due to excess calories.  Patient was referred to the office at the request of his primary care provider for evaluation of chest pain back in July 2021.  He was then referred back to our office for evaluation and management of hyperlipidemia in February 2022.  Previous cardiovascular work-up revealed moderate coronary artery calcification, therefore patient was started on aspirin and statin therapy.  Patient reports intolerance to multiple statin therapies including Crestor, pravastatin, and Lipitor.  He is presently tolerating simvastatin without issue.  He has not tolerated Zetia in the past due to headache, joint pain, nausea.  Last visit added bempodoic acid 180 mg daily given continued elevation of LDL and triglycerides, as patient is resistant to initiation of PCSK9 inhibitor or Vascepa.  However this was not approved by insurance so patient never started them podalic acid.  Recent lipid profile testing by PCP revealed triglycerides well controlled at 120, however LDL remains above goal at 108.  Given patient's coronary calcium score in the 90th percentile recommend LDL goal <70.  He continues to tolerate simvastatin without issue.  Discussed with patient initiation of PCSK9 inhibitor or Leqvio, which she is not open to.  Patient wishes to do some independent research and will notify our office  by next week of his decision.  Blood pressure is relatively well controlled, and states he averages 120-125/80s mmHg on at home monitoring.  FUNCTIONAL STATUS: Walking 30-35 minutes per day 4x per week.   ALLERGIES: Allergies  Allergen Reactions   Pravastatin Sodium Other (See Comments)    Other reaction(s): memory loss   Ezetimibe Rash   Atorvastatin Other (See Comments)    Liver enzymes increased     MEDICATION LIST PRIOR TO VISIT: Current Meds  Medication Sig   amLODipine (NORVASC) 5 MG tablet Take 5 mg by mouth daily.   aspirin EC 81 MG tablet Take 1 tablet (81 mg total) by mouth daily. Swallow whole.   co-enzyme Q-10 50 MG capsule Take 200 mg by mouth daily.   Magnesium 200 MG CHEW Chew 250 mg by mouth daily.   metoprolol succinate (TOPROL-XL) 50 MG 24 hr tablet Take 50 mg by mouth daily.   Multiple Vitamins-Minerals (MULTIVITAMIN WITH MINERALS) tablet Take 1 tablet by mouth daily.   Omega-3 Fatty Acids (FISH OIL) 1000 MG CAPS Take by mouth.   polycarbophil (FIBERCON) 625 MG tablet Take 625 mg by mouth daily.   simvastatin (ZOCOR) 40 MG tablet TAKE 1 TABLET BY MOUTH EVERYDAY AT BEDTIME     PAST MEDICAL HISTORY: Past Medical History:  Diagnosis Date   Coronary artery calcification seen on computed tomography    Hyperlipidemia    Hypertension    Obesity     PAST SURGICAL HISTORY: Past Surgical History:  Procedure Laterality Date   COLONOSCOPY WITH ESOPHAGOGASTRODUODENOSCOPY (EGD) AND ESOPHAGEAL DILATION (ED)  07/2021   HERNIA REPAIR      FAMILY HISTORY: The  patient family history includes CAD in his maternal grandfather; Heart attack in his father; Hyperlipidemia in his mother; Hypertension in his mother; Stroke in his father, maternal grandfather, and maternal grandmother.  SOCIAL HISTORY:  The patient  reports that he quit smoking about 22 years ago. His smoking use included cigarettes. He has a 18.00 pack-year smoking history. He has never used smokeless  tobacco. He reports that he does not currently use alcohol. He reports that he does not currently use drugs.  REVIEW OF SYSTEMS: Review of Systems  Constitutional: Negative for malaise/fatigue and weight gain.  Cardiovascular:  Negative for chest pain, claudication, dyspnea on exertion, leg swelling, near-syncope, orthopnea, palpitations, paroxysmal nocturnal dyspnea and syncope.  Respiratory:  Negative for hemoptysis and shortness of breath.   Hematologic/Lymphatic: Does not bruise/bleed easily.  Musculoskeletal:  Negative for muscle cramps and myalgias.  Gastrointestinal:  Negative for abdominal pain, constipation, diarrhea, hematemesis, hematochezia, melena, nausea and vomiting.  Neurological:  Negative for dizziness, light-headedness and weakness.  PHYSICAL EXAM: Vitals with BMI 07/30/2021 01/26/2021 12/05/2020  Height $Remov'5\' 8"'alXfvw$  $Remove'5\' 8"'yPAVOXZ$  -  Weight 225 lbs 226 lbs 13 oz -  BMI 07.86 75.44 -  Systolic 920 100 712  Diastolic 83 84 87  Pulse 77 87 80    CONSTITUTIONAL: Well-developed and well-nourished. No acute distress.  SKIN: Skin is warm and dry. No rash noted. No cyanosis. No pallor. No jaundice HEAD: Normocephalic and atraumatic.  EYES: No scleral icterus MOUTH/THROAT: Moist oral membranes.  NECK: No JVD present. No thyromegaly noted. No carotid bruits  LYMPHATIC: No visible cervical adenopathy.  CHEST Normal respiratory effort. No intercostal retractions  LUNGS: Clear to auscultation bilaterally.  No stridor. No wheezes. No rales.  CARDIOVASCULAR: Regular rate and rhythm, positive S1-S2, no murmurs rubs or gallops appreciated ABDOMINAL: Obese, soft, nontender, nondistended, positive bowel sounds in all 4 quadrants.  No apparent ascites.  EXTREMITIES: No peripheral edema. HEMATOLOGIC: No significant bruising NEUROLOGIC: Oriented to person, place, and time. Nonfocal. Normal muscle tone.  PSYCHIATRIC: Normal mood and affect. Normal behavior. Cooperative  *Physical exam unchanged  compared to previous.  CARDIAC DATABASE: EKG: 11/20/2020: Normal sinus rhythm, normal axis, poor R wave progression, without underlying injury pattern.   07/30/2021: Normal sinus rhythm at a rate of 74 bpm.  Normal axis.  No evidence of ischemia or underlying injury pattern.  Echocardiogram: 05/05/2020: Hyperdynamic LV systolic function with visual EF >70%. Left ventricle cavity is normal in size. Mild left ventricular hypertrophy. Normal global wall motion. Normal diastolic filling pattern, normal LAP. Calculated EF 63%. Mild (Grade I) mitral regurgitation.  Stress Testing: Exercise treadmill stress test 05/08/2020:  Exercise treadmill stress test performed using Bruce protocol.  Patient reached 10.9 METS, and 108% of age predicted maximum heart rate.  Exercise capacity was excellent.  No chest pain reported.  Normal heart rate and hemodynamic response. Stress EKG revealed no ischemic changes.  Low risk study.   Coronary artery calcification scoring performed on 05/12/2020 at [facility]:  Left main: 6  Left anterior descending 34  Left circumflex: 0  Right coronary artery: 75  Posterior descending artery: 0  Total calcium score: 115 AU, moderate calcified coronary artery plaque putting the patient just below the 90th percentile for age.  Heart Catheterization: None  LABORATORY DATA: CBC Latest Ref Rng & Units 11/23/2020 04/23/2020  WBC 4.0 - 10.5 K/uL 9.7 7.6  Hemoglobin 13.0 - 17.0 g/dL 15.8 16.2  Hematocrit 39.0 - 52.0 % 45.3 46.7  Platelets 150 - 400 K/uL 361  321    CMP Latest Ref Rng & Units 01/15/2021 11/23/2020 04/23/2020  Glucose 65 - 99 mg/dL 96 104(H) 101(H)  BUN 6 - 24 mg/dL _0 Creatinine 0.76 - 1.27 mg/dL 0.99 0.99 1.34(H)  Sodium 134 - 144 mmol/L 140 140 139  Potassium 3.5 - 5.2 mmol/L 4.8 3.4(L) 3.9  Chloride 96 - 106 mmol/L 100 103 103  CO2 20 - 29 mmol/L _1 Calcium 8.7 - 10.2 mg/dL 10.2 9.8 9.7  Total Protein 6.0 - 8.5 g/dL 7.3 8.2(H) -  Total  Bilirubin 0.0 - 1.2 mg/dL 0.5 0.8 -  Alkaline Phos 44 - 121 IU/L 70 55 -  AST 0 - 40 IU/L 33 38 -  ALT 0 - 44 IU/L 43 44 -   Lab Results  Component Value Date   CHOL 190 01/15/2021   HDL 45 01/15/2021   LDLCALC 112 (H) 01/15/2021   LDLDIRECT 110 (H) 01/15/2021   TRIG 191 (H) 01/15/2021   External Labs: 07/05/2021:  Total cholesterol 181, triglycerides 120, HDL 49, LDL 108  Collected: 04/27/2020 Hemoglobin: 15.4 g/dL Creatinine 1.27 mg/dL. eGFR: 67/78 mL/min per 1.73 m Lipid profile: Total cholesterol 286, triglycerides 183, HDL 37, LDL 214 TSH: 3.15   IMPRESSION:    ICD-10-CM   1. Coronary atherosclerosis due to calcified coronary lesion of native artery  I25.10 EKG 12-Lead   I25.84     2. Pure hypercholesterolemia  E78.00     3. Statin intolerance  Z78.9     4. Benign hypertension  I10        RECOMMENDATIONS: Fedor Kazmierski is a 50 y.o. male whose past medical history and cardiac risk factors include: Benign essential hypertension, former smoker, obesity due to excess calories.  Coronary artery calcification: Moderate coronary artery calcification, total coronary calcium score 115 AU. Continue aspirin 81 mg p.o. daily. Intolerance to the following lipid management medications: Crestor, Lipitor, pravastatin, Zetia Continue simvastatin 40 mg p.o. nightly LDL remains >70, which is his goal given elevated coronary calcium score. Patient is now open to PCSK9 inhibitor or Leqvio.  He will independently research these medications and notify our office if his decision regarding initiation next week. Counseled patient regarding the importance of strict lipid management given moderate coronary artery calcification  Benign essential hypertension:  Patient's blood pressure is minimally elevated in the office today.  He keeps to log of home blood pressure readings which are well controlled Will defer further management to PCP Continue amlodipine, metoprolol at this  time  Hyperlipidemia, pure:  LDL improved from 110 to 108 mg/dL  Patient has history of intolerance to multiple statins as well as Zetia.  He is tolerating simvastatin without issue. Continue simvastatin 40 mg nightly Patient is not open to PCSK9 inhibitor or Leqvio. Will let our office know which she wishes to initiate after he does independent research. LDL goal <70   Obesity, due to excess calories: Body mass index is 34.21 kg/m. Again discussed with patient the importance of diet and lifestyle modifications in order to lose weight and reduce overall cardiovascular risk.  FINAL MEDICATION LIST END OF ENCOUNTER: No orders of the defined types were placed in this encounter.    Current Outpatient Medications:    amLODipine (NORVASC) 5 MG tablet, Take 5 mg by mouth daily., Disp: , Rfl:    aspirin EC 81 MG tablet, Take 1 tablet (81 mg total) by mouth daily. Swallow whole., Disp: 90 tablet, Rfl: 3   co-enzyme Q-10  50 MG capsule, Take 200 mg by mouth daily., Disp: , Rfl:    Magnesium 200 MG CHEW, Chew 250 mg by mouth daily., Disp: , Rfl:    metoprolol succinate (TOPROL-XL) 50 MG 24 hr tablet, Take 50 mg by mouth daily., Disp: , Rfl:    Multiple Vitamins-Minerals (MULTIVITAMIN WITH MINERALS) tablet, Take 1 tablet by mouth daily., Disp: , Rfl:    Omega-3 Fatty Acids (FISH OIL) 1000 MG CAPS, Take by mouth., Disp: , Rfl:    polycarbophil (FIBERCON) 625 MG tablet, Take 625 mg by mouth daily., Disp: , Rfl:    simvastatin (ZOCOR) 40 MG tablet, TAKE 1 TABLET BY MOUTH EVERYDAY AT BEDTIME, Disp: 60 tablet, Rfl: 1   ALPRAZolam (XANAX) 0.5 MG tablet, as needed. (Patient not taking: Reported on 07/30/2021), Disp: , Rfl:    pantoprazole (PROTONIX) 40 MG tablet, Take 40 mg by mouth 2 (two) times daily., Disp: , Rfl:   Orders Placed This Encounter  Procedures   EKG 12-Lead    There are no Patient Instructions on file for this visit.   --Continue cardiac medications as reconciled in final medication  list. --Return in about 1 year (around 07/30/2022) for HTN, HLD, elevated Ca score . Or sooner if needed. --Continue follow-up with your primary care physician regarding the management of your other chronic comorbid conditions.  Patient's questions and concerns were addressed to his satisfaction. He voices understanding of the instructions provided during this encounter.    Alethia Berthold, PA-C 07/30/2021, 1:44 PM Office: [phone]

## 2021-08-17 ENCOUNTER — Other Ambulatory Visit: Payer: Self-pay | Admitting: Physician Assistant

## 2021-08-17 DIAGNOSIS — R1011 Right upper quadrant pain: Secondary | ICD-10-CM

## 2021-09-06 ENCOUNTER — Ambulatory Visit
Admission: RE | Admit: 2021-09-06 | Discharge: 2021-09-06 | Disposition: A | Discharge status: Home / Self Care | Payer: Managed Care, Other (non HMO) | Source: Ambulatory Visit | Attending: Physician Assistant | Admitting: Physician Assistant

## 2021-09-06 ENCOUNTER — Other Ambulatory Visit: Payer: Self-pay

## 2021-09-06 DIAGNOSIS — R1011 Right upper quadrant pain: Secondary | ICD-10-CM

## 2021-09-06 MED ORDER — IOPAMIDOL (ISOVUE-300) INJECTION 61%
100.0000 mL | Freq: Once | INTRAVENOUS | Status: AC | PRN
  Administered 2021-09-06: 09:00:00 100 mL via INTRAVENOUS

## 2021-11-20 ENCOUNTER — Ambulatory Visit: Payer: Managed Care, Other (non HMO) | Admitting: Cardiology

## 2022-04-24 IMAGING — CR DG CHEST 2V
2 series · 2 of 2 positions shown · non-contrast
Comparison: None.

CLINICAL DATA: Chest pain

EXAM:
CHEST - 2 VIEW

[w chest pa]
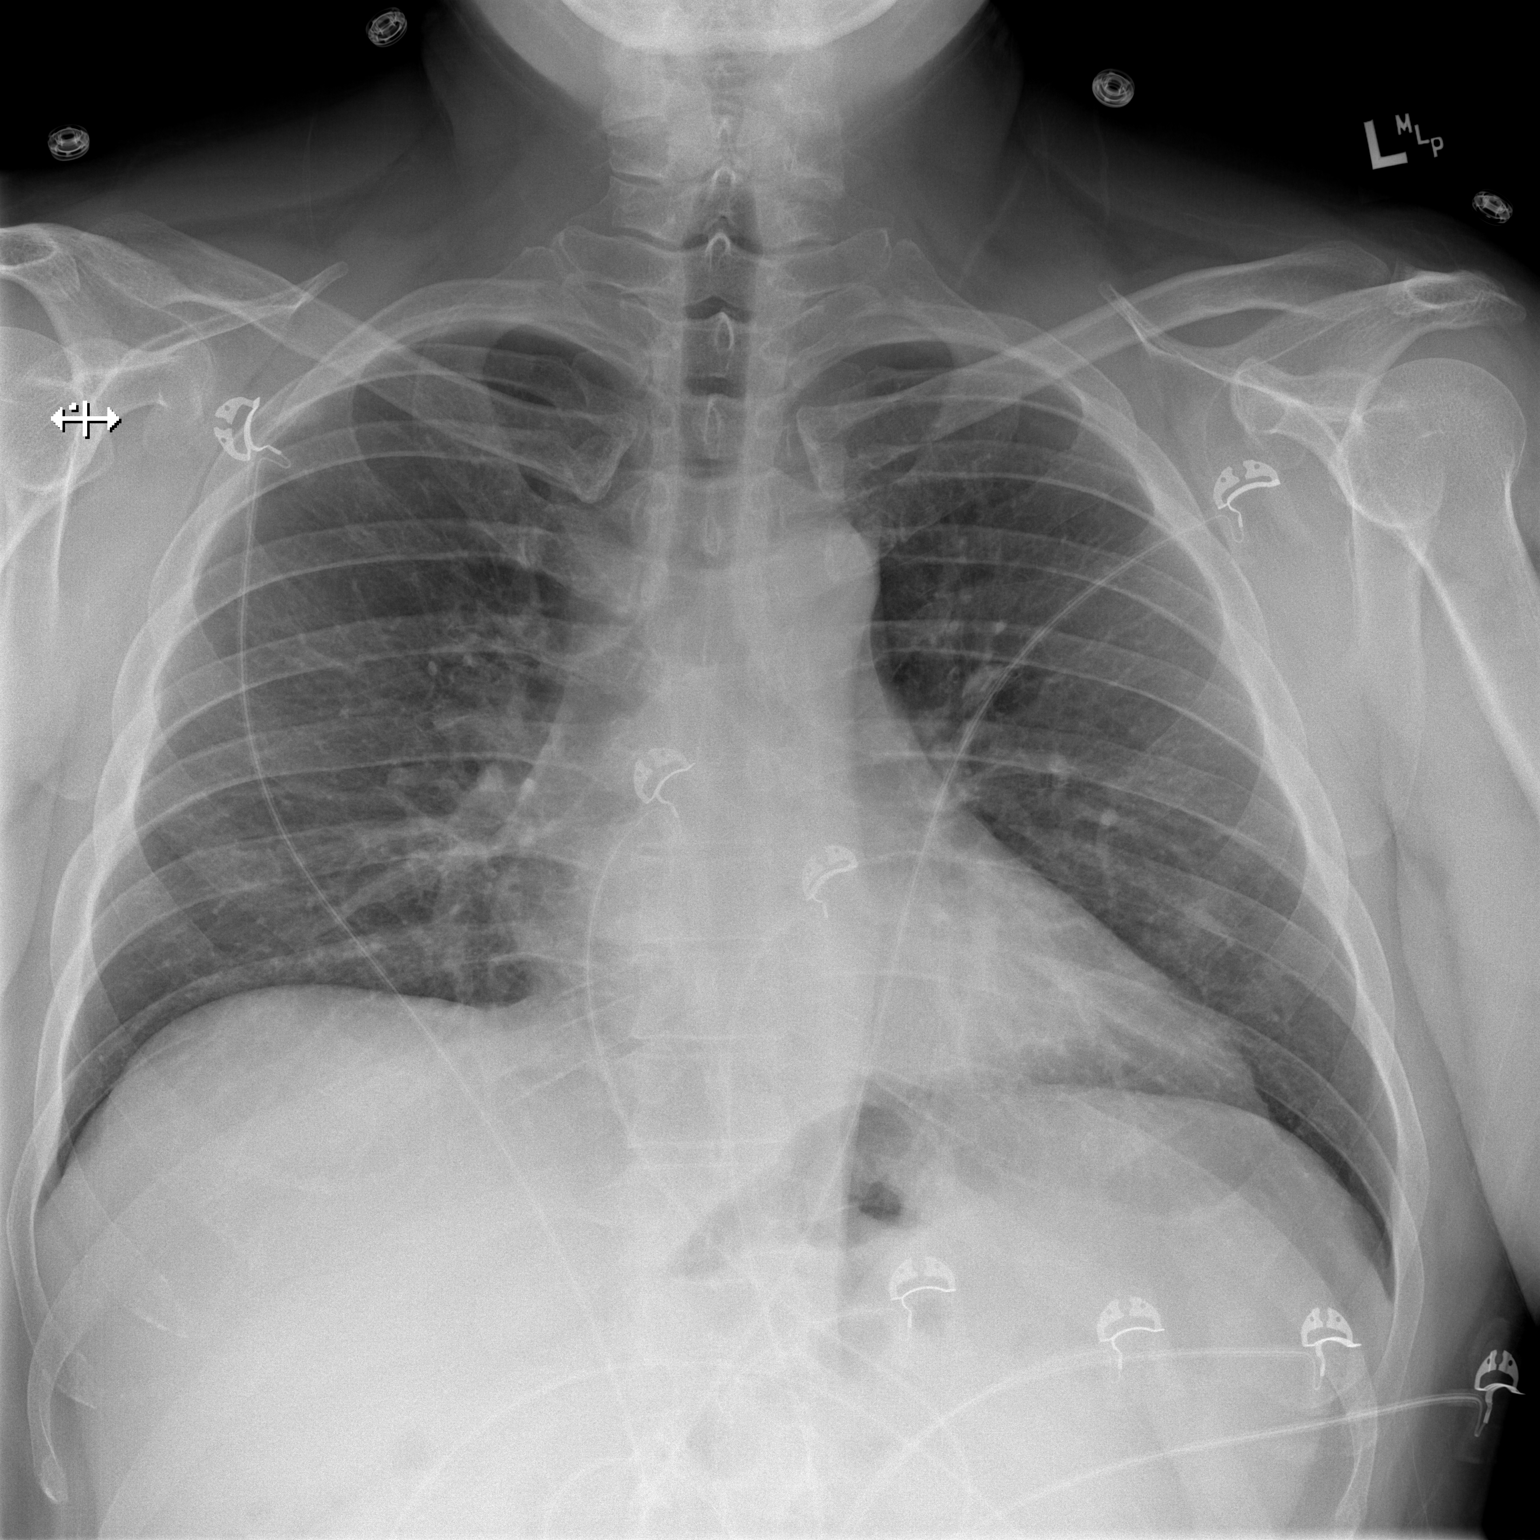

[w chest lat]
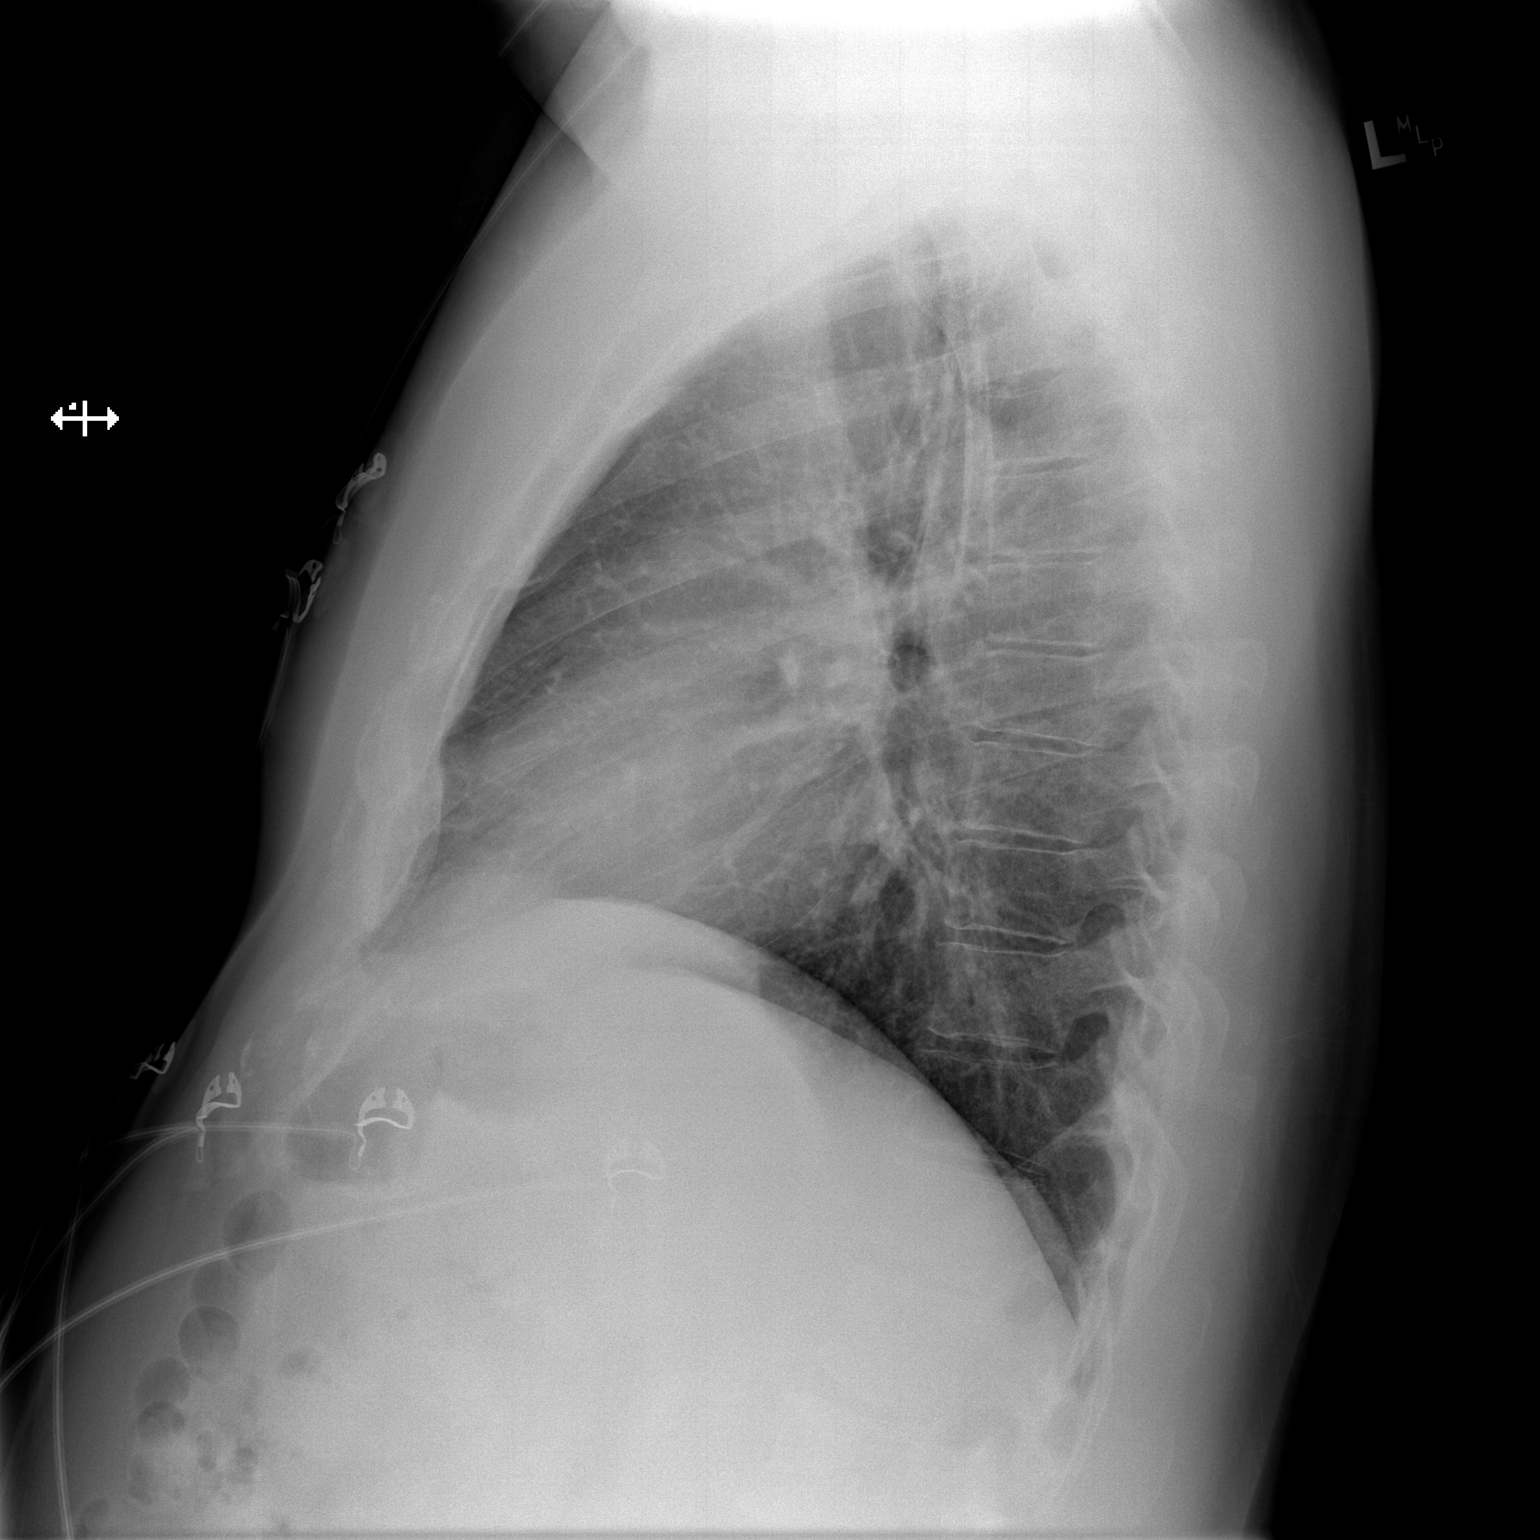

[2 of 2 positions shown; findings below may reference images not displayed]

FINDINGS: The heart size and mediastinal contours are within normal limits.
Both lungs are clear. The visualized skeletal structures are
unremarkable.
IMPRESSION: No active cardiopulmonary disease.

## 2022-07-30 ENCOUNTER — Ambulatory Visit: Payer: Managed Care, Other (non HMO) | Admitting: Student

## 2022-08-06 ENCOUNTER — Ambulatory Visit: Payer: Managed Care, Other (non HMO) | Admitting: Internal Medicine

## 2022-08-22 ENCOUNTER — Ambulatory Visit: Payer: Managed Care, Other (non HMO) | Admitting: Internal Medicine

## 2022-08-30 ENCOUNTER — Ambulatory Visit: Payer: Managed Care, Other (non HMO) | Admitting: Internal Medicine

## 2022-08-30 ENCOUNTER — Encounter: Payer: Self-pay | Admitting: Internal Medicine

## 2022-08-30 VITALS — BP 141/91 | HR 81 | Ht 68.0 in | Wt 218.4 lb

## 2022-08-30 DIAGNOSIS — E782 Mixed hyperlipidemia: Secondary | ICD-10-CM

## 2022-08-30 DIAGNOSIS — I10 Essential (primary) hypertension: Secondary | ICD-10-CM | POA: Insufficient documentation

## 2022-08-30 DIAGNOSIS — I2510 Atherosclerotic heart disease of native coronary artery without angina pectoris: Secondary | ICD-10-CM | POA: Insufficient documentation

## 2022-08-30 MED ORDER — NEXLETOL 180 MG PO TABS: 1.0000 | ORAL_TABLET | Freq: Every day | ORAL | 3 refills | Status: DC

## 2022-08-30 NOTE — Progress Notes (Signed)
Date:  08/30/2022   ID:  [name], DOB 03-15-1971, MRN [id number]  PCP:  Deland Pretty, MD  Cardiologist:  Floydene Flock, DO, [facility] (established care 05/01/2020)  Date: 08/30/22 Last Office Visit: 11/20/2020  Chief Complaint  Patient presents with   Hypertension   Coronary Artery Disease    HPI  Nicholas Gillespie is a 51 y.o. male who presents to the office with a chief complaint of "history of coronary calcifications and lipid management". Patient's past medical history and cardiovascular risk factors include: Hypertension, former smoker, coronary artery calcification (115 AU), obesity due to excess calories.  Patient is here for a follow-up visit. He has been doing well since the last time he was here. His BP is elevated but it is always higher when he comes here. His number at home are <130/80 pretty much all of the time. He found out he was allergic to red meat and has cut that out of his diet for almost a full year now. He says his cholesterol has improved significantly since cutting out red meat. Denies chest pain, shortness of breath, palpitations, diaphoresis, syncope, edema, PND, orthopnea.   FUNCTIONAL STATUS: Walking 30-35 minutes per day 4x per week.   ALLERGIES: Allergies  Allergen Reactions   Pravastatin Sodium Other (See Comments)    Other reaction(s): memory loss   Ezetimibe Rash   Atorvastatin Other (See Comments)    Liver enzymes increased     MEDICATION LIST PRIOR TO VISIT: Current Meds  Medication Sig   ALPRAZolam (XANAX) 0.5 MG tablet as needed.   amLODipine (NORVASC) 5 MG tablet Take 5 mg by mouth daily.   aspirin EC 81 MG tablet Take 1 tablet (81 mg total) by mouth daily. Swallow whole.   co-enzyme Q-10 50 MG capsule Take 200 mg by mouth daily.   Magnesium 200 MG CHEW Chew 250 mg by mouth daily.   metoprolol succinate (TOPROL-XL) 50 MG 24 hr tablet Take 50 mg by mouth daily.   Multiple Vitamins-Minerals (MULTIVITAMIN WITH MINERALS)  tablet Take 1 tablet by mouth daily.   Omega-3 Fatty Acids (FISH OIL) 1000 MG CAPS Take by mouth.   pantoprazole (PROTONIX) 40 MG tablet Take 40 mg by mouth 2 (two) times daily.   polycarbophil (FIBERCON) 625 MG tablet Take 625 mg by mouth daily.   simvastatin (ZOCOR) 40 MG tablet TAKE 1 TABLET BY MOUTH EVERYDAY AT BEDTIME     PAST MEDICAL HISTORY: Past Medical History:  Diagnosis Date   Coronary artery calcification seen on computed tomography    Hyperlipidemia    Hypertension    Obesity     PAST SURGICAL HISTORY: Past Surgical History:  Procedure Laterality Date   COLONOSCOPY WITH ESOPHAGOGASTRODUODENOSCOPY (EGD) AND ESOPHAGEAL DILATION (ED)  07/2021   HERNIA REPAIR      FAMILY HISTORY: The patient family history includes CAD in his maternal grandfather; Heart attack in his father; Hyperlipidemia in his mother; Hypertension in his mother; Stroke in his father, maternal grandfather, and maternal grandmother.  SOCIAL HISTORY:  The patient  reports that he quit smoking about 23 years ago. His smoking use included cigarettes. He has a 18.00 pack-year smoking history. He has never used smokeless tobacco. He reports that he does not currently use alcohol. He reports that he does not currently use drugs.  REVIEW OF SYSTEMS: Review of Systems  Constitutional: Negative for malaise/fatigue and weight gain.  Cardiovascular:  Negative for chest pain, claudication, dyspnea on exertion, leg swelling, near-syncope, orthopnea, palpitations, paroxysmal  nocturnal dyspnea and syncope.  Respiratory:  Negative for hemoptysis and shortness of breath.   Hematologic/Lymphatic: Does not bruise/bleed easily.  Musculoskeletal:  Negative for muscle cramps and myalgias.  Gastrointestinal:  Negative for abdominal pain, constipation, diarrhea, hematemesis, hematochezia, melena, nausea and vomiting.  Neurological:  Negative for dizziness, light-headedness and weakness.   PHYSICAL EXAM:    08/30/2022     1:58 PM 08/30/2022    1:51 PM 07/30/2021    1:01 PM  Vitals with BMI  Height  _0  _1   Weight  218 lbs 6 oz 225 lbs  BMI  41.96 22.29  Systolic 798 921 194  Diastolic 91 89 83  Pulse 81 95 77    CONSTITUTIONAL: Well-developed and well-nourished. No acute distress.  SKIN: Skin is warm and dry.  HEAD: Normocephalic and atraumatic.  MOUTH/THROAT: Moist oral membranes.  CHEST Normal respiratory effort. No intercostal retractions  LUNGS: Clear to auscultation bilaterally.  No stridor. No wheezes. No rales.  CARDIOVASCULAR: Regular rate and rhythm, positive S1-S2, no murmurs rubs or gallops appreciated ABDOMINAL: Obese, soft, nontender, nondistended, positive bowel sounds in all 4 quadrants.   EXTREMITIES: No peripheral edema.   *Physical exam unchanged compared to previous.  CARDIAC DATABASE: EKG: 08/30/2022: NSR, normal axis. No evidence of ischemia 11/20/2020: Normal sinus rhythm, normal axis, poor R wave progression, without underlying injury pattern.   07/30/2021: Normal sinus rhythm at a rate of 74 bpm.  Normal axis.  No evidence of ischemia or underlying injury pattern.  Echocardiogram: 05/05/2020: Hyperdynamic LV systolic function with visual EF >70%. Left ventricle cavity is normal in size. Mild left ventricular hypertrophy. Normal global wall motion. Normal diastolic filling pattern, normal LAP. Calculated EF 63%. Mild (Grade I) mitral regurgitation.  Stress Testing: Exercise treadmill stress test 05/08/2020:  Exercise treadmill stress test performed using Bruce protocol.  Patient reached 10.9 METS, and 108% of age predicted maximum heart rate.  Exercise capacity was excellent.  No chest pain reported.  Normal heart rate and hemodynamic response. Stress EKG revealed no ischemic changes.  Low risk study.   Coronary artery calcification scoring performed on 05/12/2020 at [facility]:  Left main: 6  Left anterior descending 34  Left circumflex: 0  Right coronary artery:  75  Posterior descending artery: 0  Total calcium score: 115 AU, moderate calcified coronary artery plaque putting the patient just below the 90th percentile for age.  Heart Catheterization: None  LABORATORY DATA:    Latest Ref Rng & Units 11/23/2020   11:55 PM 04/23/2020   12:37 PM  CBC  WBC 4.0 - 10.5 K/uL 9.7  7.6   Hemoglobin 13.0 - 17.0 g/dL 15.8  16.2   Hematocrit 39.0 - 52.0 % 45.3  46.7   Platelets 150 - 400 K/uL 361  321        Latest Ref Rng & Units 01/15/2021    8:37 AM 11/23/2020   11:55 PM 04/23/2020   12:37 PM  CMP  Glucose 65 - 99 mg/dL 96  104  101   BUN 6 - 24 mg/dL _2 Creatinine 0.76 - 1.27 mg/dL 0.99  0.99  1.34   Sodium 134 - 144 mmol/L 140  140  139   Potassium 3.5 - 5.2 mmol/L 4.8  3.4  3.9   Chloride 96 - 106 mmol/L 100  103  103   CO2 20 - 29 mmol/L _3 Calcium 8.7 - 10.2 mg/dL 10.2  9.8  9.7   Total Protein 6.0 - 8.5 g/dL 7.3  8.2    Total Bilirubin 0.0 - 1.2 mg/dL 0.5  0.8    Alkaline Phos 44 - 121 IU/L 70  55    AST 0 - 40 IU/L 33  38    ALT 0 - 44 IU/L 43  44     Lab Results  Component Value Date   CHOL 190 01/15/2021   HDL 45 01/15/2021   LDLCALC 112 (H) 01/15/2021   LDLDIRECT 110 (H) 01/15/2021   TRIG 191 (H) 01/15/2021   External Labs: 07/05/2021:  Total cholesterol 181, triglycerides 120, HDL 49, LDL 108  Collected: 04/27/2020 Hemoglobin: 15.4 g/dL Creatinine 1.27 mg/dL. eGFR: 67/78 mL/min per 1.73 m Lipid profile: Total cholesterol 286, triglycerides 183, HDL 37, LDL 214 TSH: 3.15   IMPRESSION:    ICD-10-CM   1. Coronary atherosclerosis due to calcified coronary lesion of native artery  I25.10 EKG 12-Lead   I25.84     2. Essential hypertension  I10     3. Mixed hyperlipidemia  E78.2        RECOMMENDATIONS: Nicholas Gillespie is a 51 y.o. male whose past medical history and cardiac risk factors include: Benign essential hypertension, former smoker, obesity due to excess calories.  Coronary artery  calcification: Moderate coronary artery calcification, total coronary calcium score 115 AU. Continue aspirin 81 mg p.o. daily. Intolerance to the following lipid management medications: Crestor, Lipitor, pravastatin, Zetia Continue simvastatin 40 mg p.o. nightly Will add Nexletol as he is intolerant to zetia  Benign essential hypertension:  Patient's blood pressure is minimally elevated in the office today.  He keeps to log of home blood pressure readings which are well controlled Will defer further management to PCP Continue amlodipine, metoprolol at this time  Hyperlipidemia, pure:  Patient has history of intolerance to multiple statins as well as Zetia.  He is tolerating simvastatin without issue. Continue simvastatin 40 mg nightly LDL goal <70   Follow-up in one year or sooner if needed  FINAL MEDICATION LIST END OF ENCOUNTER: No orders of the defined types were placed in this encounter.    Current Outpatient Medications:    ALPRAZolam (XANAX) 0.5 MG tablet, as needed., Disp: , Rfl:    amLODipine (NORVASC) 5 MG tablet, Take 5 mg by mouth daily., Disp: , Rfl:    aspirin EC 81 MG tablet, Take 1 tablet (81 mg total) by mouth daily. Swallow whole., Disp: 90 tablet, Rfl: 3   co-enzyme Q-10 50 MG capsule, Take 200 mg by mouth daily., Disp: , Rfl:    Magnesium 200 MG CHEW, Chew 250 mg by mouth daily., Disp: , Rfl:    metoprolol succinate (TOPROL-XL) 50 MG 24 hr tablet, Take 50 mg by mouth daily., Disp: , Rfl:    Multiple Vitamins-Minerals (MULTIVITAMIN WITH MINERALS) tablet, Take 1 tablet by mouth daily., Disp: , Rfl:    Omega-3 Fatty Acids (FISH OIL) 1000 MG CAPS, Take by mouth., Disp: , Rfl:    pantoprazole (PROTONIX) 40 MG tablet, Take 40 mg by mouth 2 (two) times daily., Disp: , Rfl:    polycarbophil (FIBERCON) 625 MG tablet, Take 625 mg by mouth daily., Disp: , Rfl:    simvastatin (ZOCOR) 40 MG tablet, TAKE 1 TABLET BY MOUTH EVERYDAY AT BEDTIME, Disp: 60 tablet, Rfl: 1  Orders  Placed This Encounter  Procedures   EKG 12-Lead       Floydene Flock, DO, [facility] 08/30/2022, 2:00 PM Office: [phone]

## 2023-06-19 IMAGING — US US ABDOMEN LIMITED
1 series · 14 of 25 positions shown · non-contrast
Comparison: None.

CLINICAL DATA: Right upper quadrant abdominal pain for 2 months

EXAM:
ULTRASOUND ABDOMEN LIMITED RIGHT UPPER QUADRANT

[Series 1: us abdomen limited · 0.23mm/px · 14 of 47 slices shown]
[im 1/47]
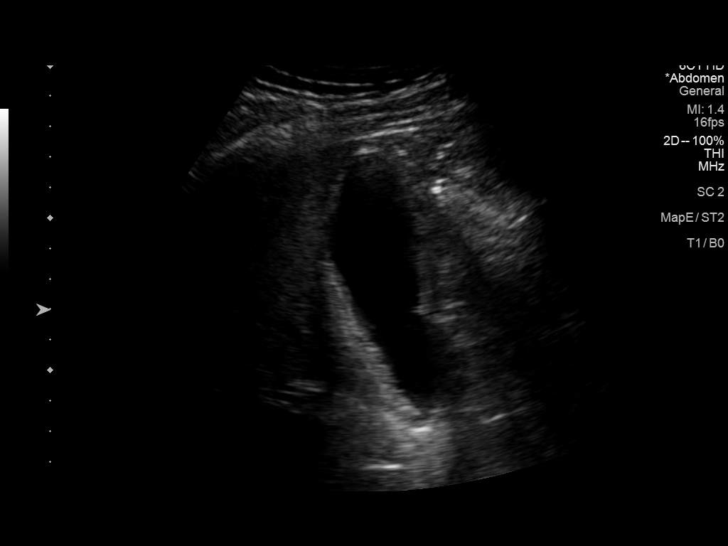
[im 4/47]
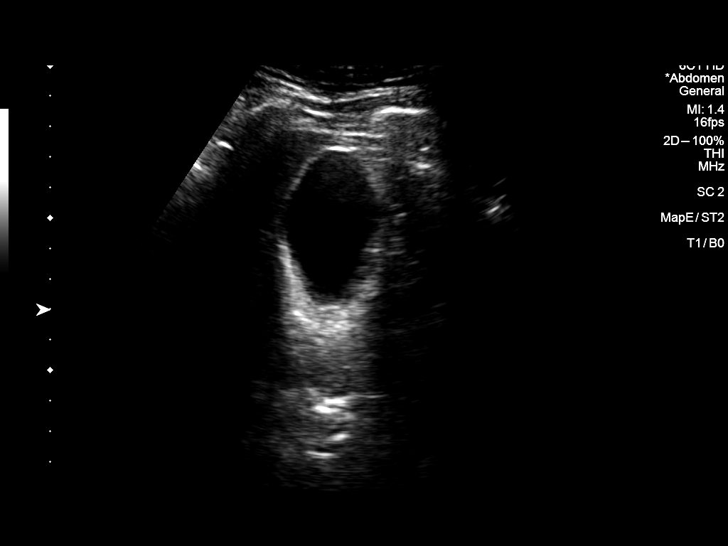
[im 8/47]
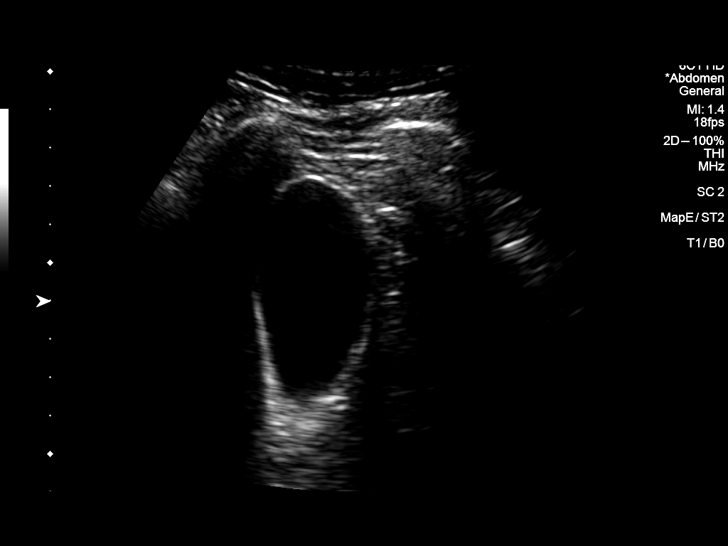
[im 12/47]
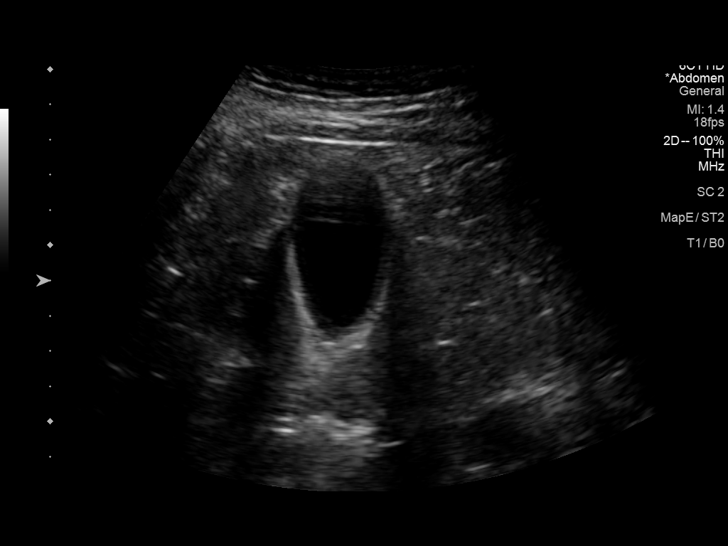
[im 16/47]
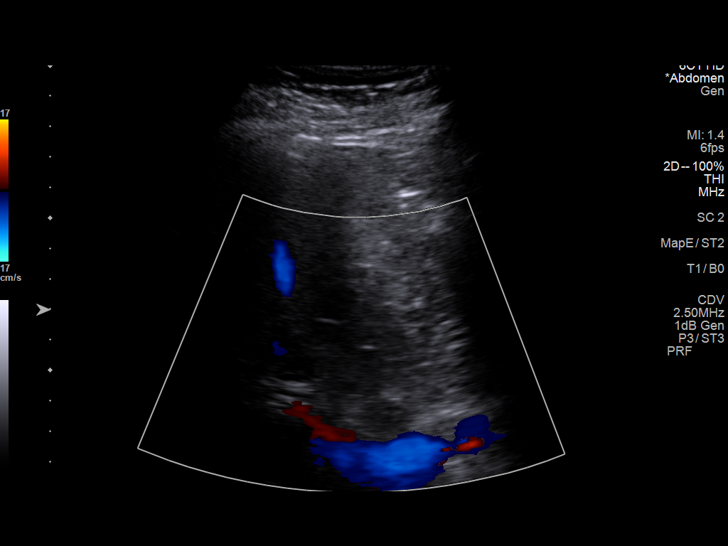
[im 18/47]
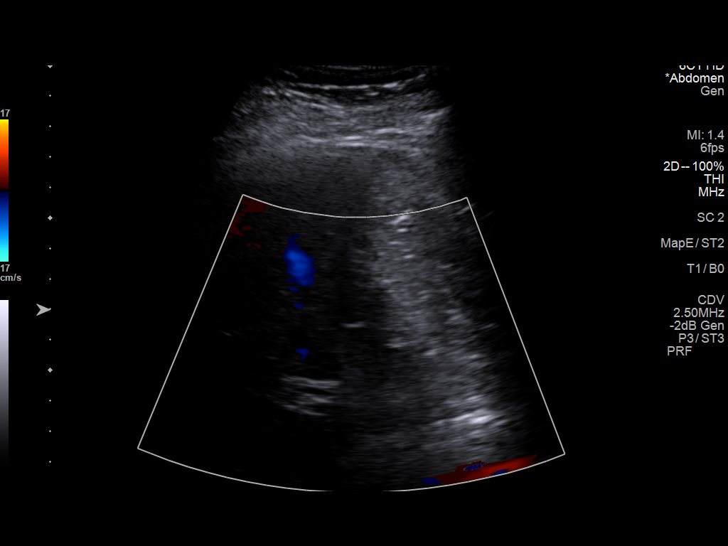
[im 22/47]
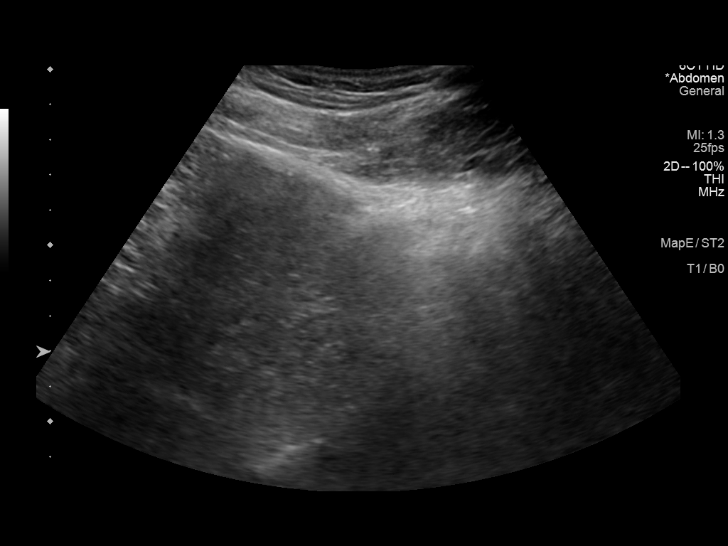
[im 25/47]
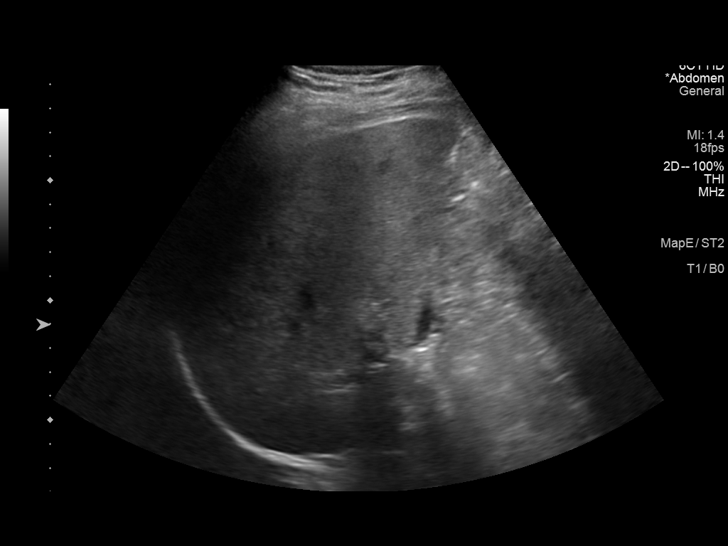
[im 29/47]
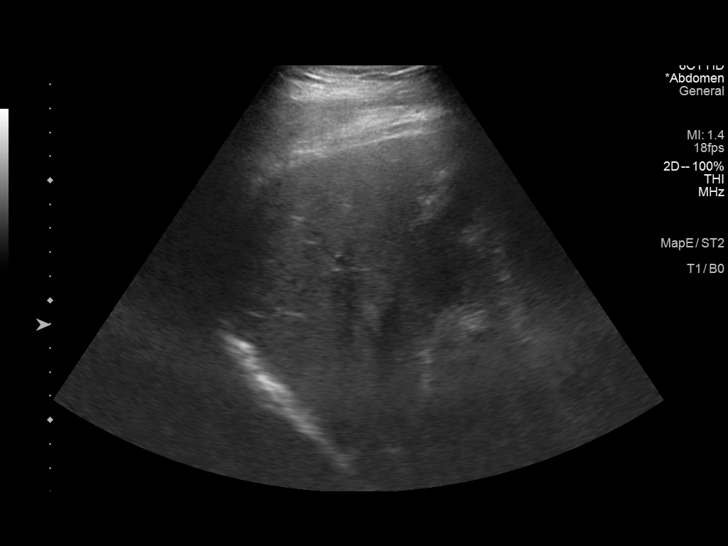
[im 31/47]
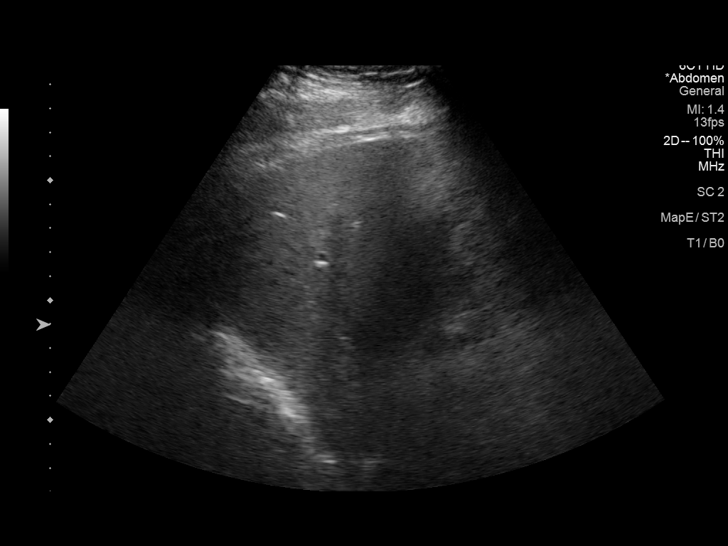
[im 35/47]
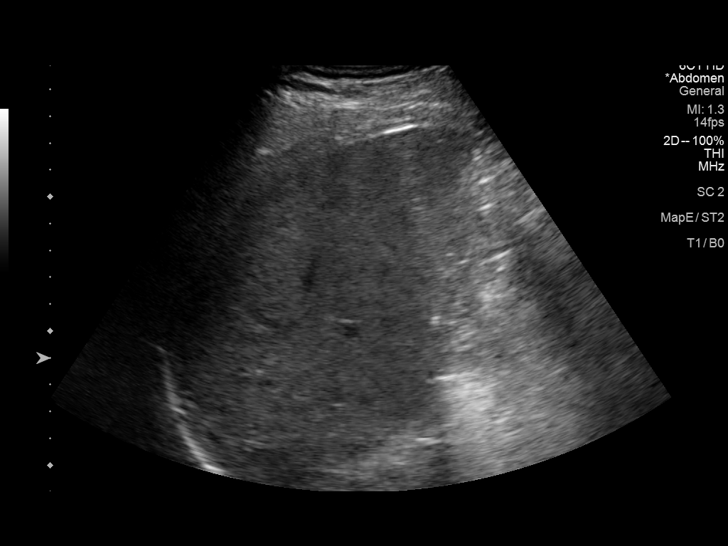
[im 39/47]
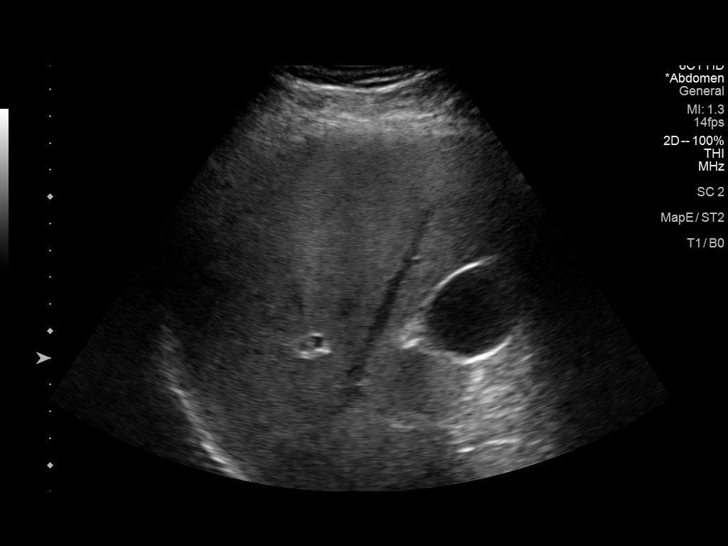
[im 43/47]
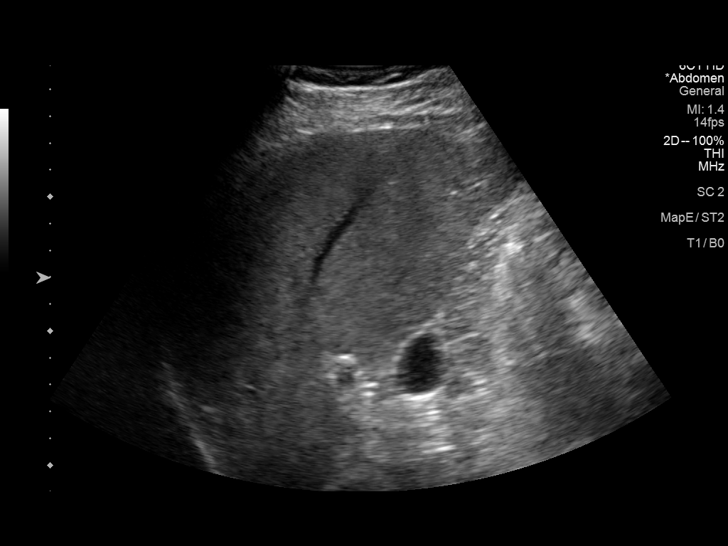
[im 47/47]
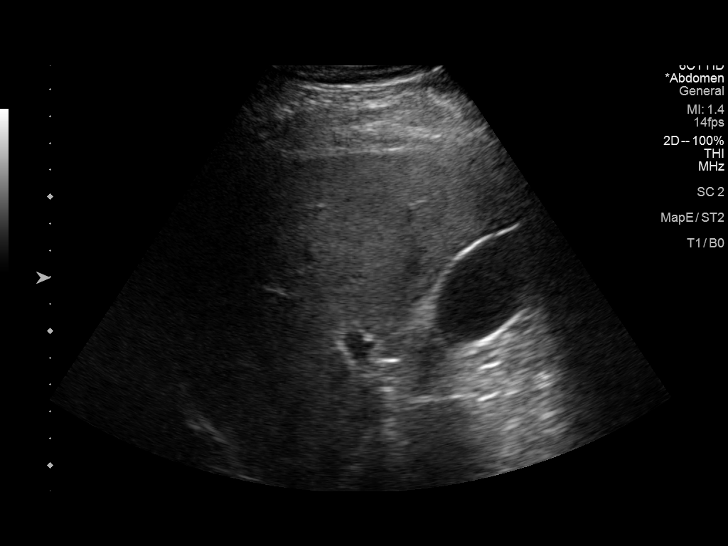

[14 of 25 positions shown; findings below may reference images not displayed]

FINDINGS: Gallbladder:

No gallstones or wall thickening visualized. No sonographic Murphy
sign noted by sonographer.

Common bile duct:

Diameter: 3 mm

Liver:

No focal lesion identified. Within normal limits in parenchymal
echogenicity. Portal vein is patent on color Doppler imaging with
normal direction of blood flow towards the liver.

Other: None.
IMPRESSION: No significant sonographic abnormality of the liver or gallbladder

## 2023-07-04 ENCOUNTER — Encounter: Payer: Self-pay | Admitting: Cardiology

## 2023-07-08 NOTE — Progress Notes (Signed)
External Labs: Collected: 9//2024 provided by PCP. Total cholesterol 189, triglycerides 177, HDL 49, LDL calculated 109 Hemoglobin 16.4 Sodium 140, potassium 4.4, chloride 101, bicarb 24. BUN 14, creatinine 1.05 AST 23, ALT 37, alkaline phosphatase 66  We will review the results at the upcoming office visit.  Regards,   Stacy Deshler [place], DO, [facility]

## 2023-09-04 ENCOUNTER — Ambulatory Visit: Payer: Managed Care, Other (non HMO) | Admitting: Internal Medicine

## 2023-09-04 ENCOUNTER — Ambulatory Visit: Payer: Self-pay | Admitting: Cardiology

## 2023-11-06 ENCOUNTER — Encounter: Payer: Self-pay | Admitting: Cardiology

## 2023-11-06 ENCOUNTER — Ambulatory Visit: Payer: Managed Care, Other (non HMO) | Attending: Cardiology | Admitting: Cardiology

## 2023-11-06 VITALS — BP 131/85 | HR 78 | Ht 69.0 in | Wt 213.0 lb

## 2023-11-06 DIAGNOSIS — I10 Essential (primary) hypertension: Secondary | ICD-10-CM

## 2023-11-06 DIAGNOSIS — E782 Mixed hyperlipidemia: Secondary | ICD-10-CM

## 2023-11-06 DIAGNOSIS — I2584 Coronary atherosclerosis due to calcified coronary lesion: Secondary | ICD-10-CM | POA: Diagnosis not present

## 2023-11-06 DIAGNOSIS — I2510 Atherosclerotic heart disease of native coronary artery without angina pectoris: Secondary | ICD-10-CM

## 2023-11-06 NOTE — Progress Notes (Signed)
Cardiology Office Note:   Date:  11/06/2023  ID:  Nicholas Gillespie, DOB 04-07-1971, MRN [id number] PCP: Merri Brunette, MD  [facility] Providers Cardiologist:  None    History of Present Illness:   Discussed the use of AI scribe software for clinical note transcription with the patient, who gave verbal consent to proceed.  History of Present Illness   The patient is a 53 year old male with a history of coronary artery calcifications, hyperlipidemia, hypertension, prior tobacco use, and obesity. He has been followed by [facility] and was last seen in November 2023, at which time he reported doing well. He has made dietary changes, including eliminating red meat from his diet due to this being a trigger for eosinophilic esophagitis. He has a known intolerance to several cholesterol medications, including Crestor, Lipitor, pravastatin, and Zetia, but has tolerated simvastatin 40mg . Nexletol was added to his regimen in November 2023.  Over the past year, the patient reports feeling good and notes that his cholesterol numbers have improved. His most recent lipid panel in September 2024 showed an LDL of 109, which he reports is lower than previous readings. He attributes this improvement to dietary changes, including avoiding red meat due to eosinophilic esophagitis, and focusing on whole foods. He admits to a lack of regular exercise, citing time constraints and personal circumstances as barriers.  The patient's blood pressure is typically in the 120s over 80, although he notes some anxiety-related fluctuations. He reports no issues with his current medications, which include simvastatin, Nexletol, and a daily aspirin. He has not experienced any chest pain or shortness of breath.  The patient's family history is significant for heart attacks and strokes, particularly in his father who had a history of avoiding medical care. However, he also notes longevity in his family, with both  grandfathers living into their high 90s. The patient is aware of the need for ongoing management of his cholesterol and is open to further interventions to lower his LDL levels.      Today patient denies chest pain, shortness of breath, lower extremity edema, fatigue, palpitations, melena, hematuria, hemoptysis, diaphoresis, weakness, presyncope, syncope, orthopnea, and PND.   Studies Reviewed:    EKG:   EKG Interpretation Date/Time:  Thursday November 06 2023 15:40:36 EST Ventricular Rate:  78 PR Interval:  154 QRS Duration:  78 QT Interval:  374 QTC Calculation: 426 R Axis:   16  Text Interpretation: Normal sinus rhythm Normal ECG When compared with ECG of 23-Apr-2020 12:19, PREVIOUS ECG IS PRESENT Confirmed by Perlie Gold [phone]) on 11/06/2023 3:47:08 PM   chocardiogram: 05/05/2020: Hyperdynamic LV systolic function with visual EF >70%. Left ventricle cavity is normal in size. Mild left ventricular hypertrophy. Normal global wall motion. Normal diastolic filling pattern, normal LAP. Calculated EF 63%. Mild (Grade I) mitral regurgitation.   Stress Testing: Exercise treadmill stress test 05/08/2020:  Exercise treadmill stress test performed using Bruce protocol.  Patient reached 10.9 METS, and 108% of age predicted maximum heart rate.  Exercise capacity was excellent.  No chest pain reported.  Normal heart rate and hemodynamic response. Stress EKG revealed no ischemic changes.  Low risk study.    Coronary artery calcification scoring performed on 05/12/2020 at [facility]:  Left main: 6  Left anterior descending 34  Left circumflex: 0  Right coronary artery: 75  Posterior descending artery: 0  Total calcium score: 115 AU, moderate calcified coronary artery plaque putting the patient just below the 90th percentile for age.  Risk Assessment/Calculations:              Physical Exam:   VS:  BP 131/85   Pulse 78   Ht 5\' 9"  (1.753 m)   Wt 213 lb (96.6 kg)   SpO2 98%    BMI 31.45 kg/m    Wt Readings from Last 3 Encounters:  11/06/23 213 lb (96.6 kg)  08/30/22 218 lb 6.4 oz (99.1 kg)  07/30/21 225 lb (102.1 kg)     Physical Exam Vitals reviewed.  Constitutional:      Appearance: Normal appearance.  HENT:     Head: Normocephalic.  Eyes:     Pupils: Pupils are equal, round, and reactive to light.  Cardiovascular:     Rate and Rhythm: Normal rate and regular rhythm.     Pulses: Normal pulses.     Heart sounds: Normal heart sounds.  Pulmonary:     Effort: Pulmonary effort is normal.     Breath sounds: Normal breath sounds.  Abdominal:     General: Abdomen is flat.     Palpations: Abdomen is soft.  Musculoskeletal:     Right lower leg: No edema.     Left lower leg: No edema.  Skin:    General: Skin is warm and dry.     Capillary Refill: Capillary refill takes less than 2 seconds.  Neurological:     General: No focal deficit present.     Mental Status: He is alert and oriented to person, place, and time.  Psychiatric:        Mood and Affect: Mood normal.        Behavior: Behavior normal.        Thought Content: Thought content normal.        Judgment: Judgment normal.     ASSESSMENT AND PLAN:     Assessment and Plan    Hyperlipidemia LDL improved to 109 on Simvastatin 40mg  and Nexletol, but still above target of 70 (coronary artery calcifications). Discussed potential familial hypercholesterolemia and the benefits of PCSK9 inhibitors like Repatha. -Refer to lipid clinic for potential initiation of PCSK9 inhibitor. Apparently patient's insurance has declined prior authorization for this in the past despite intolerance to multiple statins and LDL not at goal.  -Continue Simvastatin 40mg  and Nexletol.  Hypertension Well controlled with Metoprolol and Amlodipine. No reported side effects. -Continue Metoprolol and Amlodipine.  Coronary Artery Calcifications Ca score 115 AU per 2021 calcium scoring CT at [facility]. No current cardiac  symptoms. Discussed the importance of maintaining current lifestyle modifications and medication regimen. -Continue daily Aspirin. -Continue cholesterol management -Continue current lifestyle modifications (healthy diet, no smoking).   Follow-up -Schedule follow-up appointment in 1 year with Dr. Odis Hollingshead or me.            Signed, Perlie Gold, PA-C

## 2023-11-06 NOTE — Patient Instructions (Signed)
Medication Instructions:   Your physician recommends that you continue on your current medications as directed. Please refer to the Current Medication list given to you today.   *If you need a refill on your cardiac medications before your next appointment, please call your pharmacy*   Lab Work: NONE ORDERED  TODAY    If you have labs (blood work) drawn today and your tests are completely normal, you will receive your results only by: MyChart Message (if you have MyChart) OR A paper copy in the mail If you have any lab test that is abnormal or we need to change your treatment, we will call you to review the results.   Testing/Procedures: NONE ORDERED  TODAY     Follow-Up: At [facility], you and your health needs are our priority.  As part of our continuing mission to provide you with exceptional heart care, we have created designated Provider Care Teams.  These Care Teams include your primary Cardiologist (physician) and Advanced Practice Providers (APPs -  Physician Assistants and Nurse Practitioners) who all work together to provide you with the care you need, when you need it.  We recommend signing up for the patient portal called "MyChart".  Sign up information is provided on this After Visit Summary.  MyChart is used to connect with patients for Virtual Visits (Telemedicine).  Patients are able to view lab/test results, encounter notes, upcoming appointments, etc.  Non-urgent messages can be sent to your provider as well.   To learn more about what you can do with MyChart, go to [email or web address].    Your next appointment:  You have been referred to [facility] FOR CHOLESTEROL MANAGEMENT  NEXT AVAILABLE    1 year(s)  Provider:    Dr. Odis Hollingshead   or Perlie Gold, PA-C        Other Instructions

## 2023-12-19 ENCOUNTER — Ambulatory Visit: Payer: Managed Care, Other (non HMO) | Attending: Cardiovascular Disease | Admitting: Pharmacist

## 2023-12-19 ENCOUNTER — Other Ambulatory Visit (HOSPITAL_COMMUNITY): Payer: Self-pay

## 2023-12-19 ENCOUNTER — Telehealth: Payer: Self-pay | Admitting: Pharmacy Technician

## 2023-12-19 DIAGNOSIS — E782 Mixed hyperlipidemia: Secondary | ICD-10-CM | POA: Diagnosis not present

## 2023-12-19 DIAGNOSIS — I2510 Atherosclerotic heart disease of native coronary artery without angina pectoris: Secondary | ICD-10-CM

## 2023-12-19 NOTE — Progress Notes (Signed)
 Patient ID: Nicholas Gillespie                 DOB: 04-Aug-1971                    MRN: [id number]      HPI: Nicholas Gillespie is a 53 y.o. male patient referred to lipid clinic by Nicholas Gold, PA. PMH is significant for coronary artery calcifications, hyperlipidemia, hypertension, prior tobacco use, and obesity. He has a known intolerance to several cholesterol medications, including Crestor, Lipitor, pravastatin, and Zetia, but has tolerated simvastatin 40mg . Nexletol was added to his regimen in November 2023. LDL-C in Nov 2024 was 109, TG 177  Patient presents today to lipid clinic.  Reports that he has never taken Nexletol.  This may have been medication his insurance refused to pay for.  Reports previously they would not pay for Repatha either.  He is willing to try if we can get insurance to cover.  He is on simvastatin 40 mg daily with an LDL-C of 109.  Intolerant to atorvastatin, pravastatin and Zetia.  Takes 3000 mg of OTC fish oil.  Reports his triglycerides are often up and down.  Denies any alcohol use.  No exercise.  Encouraged him to find something he enjoys doing for exercise and do this on a regular basis.  Even if it is just going for a walk.  Injections technique for Repatha was reviewed.   Coronary artery calcification scoring performed on 05/12/2020 at [facility]:  Left main: 6  Left anterior descending 34  Left circumflex: 0  Right coronary artery: 75  Posterior descending artery: 0  Total calcium score: 115 AU, moderate calcified coronary artery plaque putting the patient just below the 90th percentile for age.    Reviewed options for lowering LDL cholesterol, including ezetimibe, PCSK-9 inhibitors, bempedoic acid and inclisiran.  Discussed mechanisms of action, dosing, side effects and potential decreases in LDL cholesterol.  Also reviewed cost information and potential options for patient assistance.   Current Medications: simvastatin 40mg  daily, omgea 3 fish  oil Intolerances: atorvastatin 40mg  (increased LFT), zetia 10mg  (rash), pravastatin (memory issues) Risk Factors: premature disease, HTN LDL-C goal: Less than 55 due to premature disease ApoB goal: Less than 60  Diet: water No soda Occasional fried food  Exercise: none  Family History: heart attacks and strokes, particularly in his father who had a history of avoiding medical care. However, he also notes longevity in his family, with both grandfathers living into their high 90s.   Social History: no tobacco, no ETOH  Labs: Lipid Panel   External Labs: Collected: 9//2024 provided by PCP. Total cholesterol 189, triglycerides 177, HDL 49, LDL calculated 109 Hemoglobin 16.4 Sodium 140, potassium 4.4, chloride 101, bicarb 24. BUN 14, creatinine 1.05 AST 23, ALT 37, alkaline phosphatase 66    Component Value Date/Time   CHOL 190 01/15/2021 0837   TRIG 191 (H) 01/15/2021 0837   HDL 45 01/15/2021 0837   LDLCALC 112 (H) 01/15/2021 0837   LDLDIRECT 110 (H) 01/15/2021 0837   LABVLDL 33 01/15/2021 0837    Past Medical History:  Diagnosis Date   Coronary artery calcification seen on computed tomography    Hyperlipidemia    Hypertension    Obesity     Current Outpatient Medications on File Prior to Visit  Medication Sig Dispense Refill   ALPRAZolam (XANAX) 0.5 MG tablet as needed.     amLODipine (NORVASC) 5 MG tablet Take 5 mg by  mouth daily.     aspirin EC 81 MG tablet Take 1 tablet (81 mg total) by mouth daily. Swallow whole. 90 tablet 3   co-enzyme Q-10 50 MG capsule Take 200 mg by mouth daily.     Magnesium 200 MG CHEW Chew 250 mg by mouth daily.     metoprolol succinate (TOPROL-XL) 50 MG 24 hr tablet Take 50 mg by mouth daily.     Multiple Vitamins-Minerals (MULTIVITAMIN WITH MINERALS) tablet Take 1 tablet by mouth daily.     Omega-3 Fatty Acids (FISH OIL) 1000 MG CAPS Take 3,000 mg by mouth daily.     polycarbophil (FIBERCON) 625 MG tablet Take 625 mg by mouth daily.      simvastatin (ZOCOR) 40 MG tablet TAKE 1 TABLET BY MOUTH EVERYDAY AT BEDTIME 60 tablet 1   No current facility-administered medications on file prior to visit.    Allergies  Allergen Reactions   Pravastatin Sodium Other (See Comments)    Other reaction(s): memory loss   Ezetimibe Rash   Atorvastatin Other (See Comments)    Liver enzymes increased     Assessment/Plan:  1. Hyperlipidemia -  Mixed hyperlipidemia Assessment LDL-C is above goal of less than 55 on simvastatin 40 mg daily Intolerant to atorvastatin and Zetia and pravastatin Injection technique for Repatha was reviewed Patient denies any alcohol use or tobacco use No exercise. he was encouraged to do so Unsure if his deductible is on both medical and prescription or just medical Information for co-pay card given  Plan: Submit prior authorization for Repatha Repeat labs in 3 months If triglycerides still elevated at that time could consider trying to get him Vascepa    Thank you,  Olene Floss, Pharm.D, BCACP, CPP [facility] A Division of [facility] [address] [place], [place] [address]  Phone: [phone]; Fax: [phone]

## 2023-12-19 NOTE — Patient Instructions (Signed)
 I will submit a prior authorization for Repatha. I will call you once I hear back. Please call me at [phone] with any questions.  I would encourage you to start exercising on a regular basis   Repatha is a cholesterol medication that improved your body's ability to get rid of "bad cholesterol" known as LDL. It can lower your LDL up to 60%! It is an injection that is given under the skin every 2 weeks. The medication often requires a prior authorization from your insurance company. We will take care of submitting all the necessary information to your insurance company to get it approved. The most common side effects of Repatha include runny nose, symptoms of the common cold, rarely flu or flu-like symptoms, back/muscle pain in about 3-4% of the patients, and redness, pain, or bruising at the injection site. Tell your healthcare provider if you have any side effect that bothers you or that does not go away.

## 2023-12-19 NOTE — Assessment & Plan Note (Signed)
 Assessment LDL-C is above goal of less than 55 on simvastatin 40 mg daily Intolerant to atorvastatin and Zetia and pravastatin Injection technique for Repatha was reviewed Patient denies any alcohol use or tobacco use No exercise. he was encouraged to do so Unsure if his deductible is on both medical and prescription or just medical Information for co-pay card given  Plan: Submit prior authorization for Repatha Repeat labs in 3 months If triglycerides still elevated at that time could consider trying to get him Vascepa

## 2023-12-19 NOTE — Telephone Encounter (Signed)
 Ran test claim for repatha. For a 28 day supply and the co-pay is 40.00 . PA is not needed at this time. This test claim was processed through [facility]- copay amounts may vary at other pharmacies due to pharmacy/plan contracts, or as the patient moves through the different stages of their insurance plan.

## 2023-12-23 MED ORDER — REPATHA SURECLICK 140 MG/ML ~~LOC~~ SOAJ: 1.0000 mL | SUBCUTANEOUS | 11 refills | Status: DC

## 2023-12-23 NOTE — Telephone Encounter (Signed)
 Pt made aware of approval. RX sent in and labs ordered.

## 2023-12-23 NOTE — Addendum Note (Signed)
 Addended by: Malena Peer D on: 12/23/2023 10:21 AM   Modules accepted: Orders

## 2024-01-31 LAB — LIPID PANEL
Chol/HDL Ratio: 2 ratio (ref 0.0–5.0)
Cholesterol, Total: 106 mg/dL (ref 100–199)
HDL: 53 mg/dL (ref >39)
LDL Chol Calc (NIH): 28 mg/dL (ref 0–99)
Triglycerides: 152 mg/dL — ABNORMAL HIGH (ref 0–149)
VLDL Cholesterol Cal: 25 mg/dL (ref 5–40)

## 2024-02-02 ENCOUNTER — Encounter: Payer: Self-pay | Admitting: Pharmacist

## 2024-04-12 ENCOUNTER — Encounter: Payer: Self-pay | Admitting: Pharmacist

## 2024-06-30 LAB — LAB REPORT - SCANNED: EGFR: 82

## 2024-10-01 ENCOUNTER — Encounter: Payer: Self-pay | Admitting: Cardiology

## 2024-11-16 ENCOUNTER — Other Ambulatory Visit: Payer: Self-pay | Admitting: Cardiology
